# Patient Record
Sex: Female | Born: 1965 | Race: White | Hispanic: No | Marital: Single | Smoking: Current every day smoker
Health system: Southern US, Community
[De-identification: ages and names within clinical notes are randomized; demographics above are authoritative.]

## PROBLEM LIST (undated history)

## (undated) DIAGNOSIS — I10 Essential (primary) hypertension: Secondary | ICD-10-CM

## (undated) DIAGNOSIS — I2510 Atherosclerotic heart disease of native coronary artery without angina pectoris: Secondary | ICD-10-CM

## (undated) DIAGNOSIS — I219 Acute myocardial infarction, unspecified: Secondary | ICD-10-CM

## (undated) DIAGNOSIS — M549 Dorsalgia, unspecified: Secondary | ICD-10-CM

## (undated) DIAGNOSIS — F329 Major depressive disorder, single episode, unspecified: Secondary | ICD-10-CM

## (undated) DIAGNOSIS — E785 Hyperlipidemia, unspecified: Secondary | ICD-10-CM

## (undated) DIAGNOSIS — G8929 Other chronic pain: Secondary | ICD-10-CM

## (undated) DIAGNOSIS — I639 Cerebral infarction, unspecified: Secondary | ICD-10-CM

## (undated) DIAGNOSIS — H5440 Blindness, one eye, unspecified eye: Secondary | ICD-10-CM

## (undated) DIAGNOSIS — F32A Depression, unspecified: Secondary | ICD-10-CM

## (undated) HISTORY — DX: Essential (primary) hypertension: I10

## (undated) HISTORY — DX: Hyperlipidemia, unspecified: E78.5

## (undated) HISTORY — DX: Other chronic pain: G89.29

## (undated) HISTORY — DX: Dorsalgia, unspecified: M54.9

## (undated) HISTORY — PX: CORONARY STENT PLACEMENT: SHX1402

---

## 2000-05-17 DIAGNOSIS — I639 Cerebral infarction, unspecified: Secondary | ICD-10-CM

## 2000-05-17 HISTORY — DX: Cerebral infarction, unspecified: I63.9

## 2001-10-18 ENCOUNTER — Ambulatory Visit (HOSPITAL_COMMUNITY): Admission: RE | Admit: 2001-10-18 | Discharge: 2001-10-18 | Payer: Self-pay | Admitting: Internal Medicine

## 2001-10-18 ENCOUNTER — Encounter: Payer: Self-pay | Admitting: Internal Medicine

## 2003-05-18 DIAGNOSIS — I219 Acute myocardial infarction, unspecified: Secondary | ICD-10-CM

## 2003-05-18 HISTORY — DX: Acute myocardial infarction, unspecified: I21.9

## 2004-11-21 ENCOUNTER — Inpatient Hospital Stay (HOSPITAL_COMMUNITY): Admission: AD | Admit: 2004-11-21 | Discharge: 2004-11-25 | Payer: Self-pay | Admitting: Psychiatry

## 2004-11-21 ENCOUNTER — Ambulatory Visit: Payer: Self-pay | Admitting: Psychiatry

## 2004-11-24 ENCOUNTER — Encounter (HOSPITAL_COMMUNITY): Payer: Self-pay | Admitting: Psychiatry

## 2005-05-06 ENCOUNTER — Inpatient Hospital Stay (HOSPITAL_COMMUNITY): Admission: EM | Admit: 2005-05-06 | Discharge: 2005-05-09 | Payer: Self-pay | Admitting: Cardiology

## 2005-05-06 ENCOUNTER — Ambulatory Visit: Payer: Self-pay | Admitting: Cardiology

## 2005-05-07 ENCOUNTER — Ambulatory Visit: Payer: Self-pay | Admitting: Cardiology

## 2005-05-07 ENCOUNTER — Encounter: Payer: Self-pay | Admitting: Cardiology

## 2005-08-16 ENCOUNTER — Ambulatory Visit: Payer: Self-pay | Admitting: Cardiology

## 2005-11-08 ENCOUNTER — Ambulatory Visit: Payer: Self-pay | Admitting: Cardiology

## 2005-12-08 ENCOUNTER — Ambulatory Visit: Payer: Self-pay | Admitting: Cardiology

## 2006-08-28 ENCOUNTER — Emergency Department (HOSPITAL_COMMUNITY): Admission: EM | Admit: 2006-08-28 | Discharge: 2006-08-28 | Payer: Self-pay | Admitting: Emergency Medicine

## 2006-09-05 ENCOUNTER — Emergency Department (HOSPITAL_COMMUNITY): Admission: EM | Admit: 2006-09-05 | Discharge: 2006-09-06 | Payer: Self-pay | Admitting: Emergency Medicine

## 2007-05-16 ENCOUNTER — Ambulatory Visit: Payer: Self-pay | Admitting: Cardiovascular Disease

## 2007-06-12 ENCOUNTER — Ambulatory Visit: Payer: Self-pay | Admitting: Cardiovascular Disease

## 2007-06-12 ENCOUNTER — Encounter (HOSPITAL_COMMUNITY): Admission: RE | Admit: 2007-06-12 | Discharge: 2007-07-12 | Payer: Self-pay | Admitting: Cardiovascular Disease

## 2007-07-21 ENCOUNTER — Emergency Department (HOSPITAL_COMMUNITY): Admission: EM | Admit: 2007-07-21 | Discharge: 2007-07-21 | Payer: Self-pay | Admitting: Emergency Medicine

## 2007-08-09 ENCOUNTER — Emergency Department (HOSPITAL_COMMUNITY): Admission: EM | Admit: 2007-08-09 | Discharge: 2007-08-09 | Payer: Self-pay | Admitting: Emergency Medicine

## 2007-08-28 ENCOUNTER — Emergency Department (HOSPITAL_COMMUNITY): Admission: EM | Admit: 2007-08-28 | Discharge: 2007-08-28 | Payer: Self-pay | Admitting: Emergency Medicine

## 2007-09-11 ENCOUNTER — Emergency Department (HOSPITAL_COMMUNITY): Admission: EM | Admit: 2007-09-11 | Discharge: 2007-09-11 | Payer: Self-pay | Admitting: Emergency Medicine

## 2008-01-17 ENCOUNTER — Ambulatory Visit (HOSPITAL_COMMUNITY): Admission: RE | Admit: 2008-01-17 | Discharge: 2008-01-17 | Payer: Self-pay | Admitting: Family Medicine

## 2008-02-29 ENCOUNTER — Emergency Department (HOSPITAL_COMMUNITY): Admission: EM | Admit: 2008-02-29 | Discharge: 2008-02-29 | Payer: Self-pay | Admitting: Emergency Medicine

## 2008-07-14 ENCOUNTER — Emergency Department (HOSPITAL_COMMUNITY): Admission: EM | Admit: 2008-07-14 | Discharge: 2008-07-14 | Payer: Self-pay | Admitting: Emergency Medicine

## 2008-08-06 ENCOUNTER — Emergency Department (HOSPITAL_COMMUNITY): Admission: EM | Admit: 2008-08-06 | Discharge: 2008-08-06 | Payer: Self-pay | Admitting: Emergency Medicine

## 2008-08-28 ENCOUNTER — Emergency Department (HOSPITAL_COMMUNITY): Admission: EM | Admit: 2008-08-28 | Discharge: 2008-08-28 | Payer: Self-pay | Admitting: Emergency Medicine

## 2008-09-03 ENCOUNTER — Encounter (INDEPENDENT_AMBULATORY_CARE_PROVIDER_SITE_OTHER): Payer: Self-pay | Admitting: *Deleted

## 2008-09-03 LAB — CONVERTED CEMR LAB
AST: 34 units/L
Albumin: 4 g/dL
Alkaline Phosphatase: 86 units/L
BUN: 16 mg/dL
Chloride: 103 meq/L
Creatinine, Ser: 0.79 mg/dL
HDL: 53 mg/dL
LDL Cholesterol: 121 mg/dL
Total Protein: 8.4 g/dL

## 2008-09-06 ENCOUNTER — Emergency Department (HOSPITAL_COMMUNITY): Admission: EM | Admit: 2008-09-06 | Discharge: 2008-09-06 | Payer: Self-pay | Admitting: Emergency Medicine

## 2009-01-31 ENCOUNTER — Emergency Department (HOSPITAL_COMMUNITY): Admission: EM | Admit: 2009-01-31 | Discharge: 2009-01-31 | Payer: Self-pay | Admitting: Emergency Medicine

## 2009-02-20 ENCOUNTER — Emergency Department (HOSPITAL_COMMUNITY): Admission: EM | Admit: 2009-02-20 | Discharge: 2009-02-20 | Payer: Self-pay | Admitting: Emergency Medicine

## 2009-04-11 ENCOUNTER — Emergency Department (HOSPITAL_COMMUNITY): Admission: EM | Admit: 2009-04-11 | Discharge: 2009-04-11 | Payer: Self-pay | Admitting: Emergency Medicine

## 2009-05-17 HISTORY — PX: ESOPHAGOGASTRODUODENOSCOPY: SHX1529

## 2009-05-17 HISTORY — PX: COLONOSCOPY: SHX174

## 2009-06-03 ENCOUNTER — Emergency Department (HOSPITAL_COMMUNITY): Admission: EM | Admit: 2009-06-03 | Discharge: 2009-06-03 | Payer: Self-pay | Admitting: Emergency Medicine

## 2009-06-06 DIAGNOSIS — M549 Dorsalgia, unspecified: Secondary | ICD-10-CM | POA: Insufficient documentation

## 2009-06-06 DIAGNOSIS — F1010 Alcohol abuse, uncomplicated: Secondary | ICD-10-CM | POA: Insufficient documentation

## 2009-06-09 ENCOUNTER — Encounter (INDEPENDENT_AMBULATORY_CARE_PROVIDER_SITE_OTHER): Payer: Self-pay | Admitting: *Deleted

## 2009-07-20 ENCOUNTER — Encounter: Payer: Self-pay | Admitting: Orthopedic Surgery

## 2009-07-20 ENCOUNTER — Emergency Department (HOSPITAL_COMMUNITY): Admission: EM | Admit: 2009-07-20 | Discharge: 2009-07-20 | Payer: Self-pay | Admitting: Emergency Medicine

## 2009-11-07 ENCOUNTER — Emergency Department (HOSPITAL_COMMUNITY): Admission: EM | Admit: 2009-11-07 | Discharge: 2009-11-07 | Payer: Self-pay | Admitting: Emergency Medicine

## 2009-11-24 ENCOUNTER — Ambulatory Visit: Payer: Self-pay | Admitting: Orthopedic Surgery

## 2009-11-25 ENCOUNTER — Encounter (INDEPENDENT_AMBULATORY_CARE_PROVIDER_SITE_OTHER): Payer: Self-pay | Admitting: *Deleted

## 2009-11-26 ENCOUNTER — Ambulatory Visit (HOSPITAL_COMMUNITY): Admission: RE | Admit: 2009-11-26 | Discharge: 2009-11-26 | Payer: Self-pay | Admitting: Orthopedic Surgery

## 2009-12-08 ENCOUNTER — Encounter: Payer: Self-pay | Admitting: Orthopedic Surgery

## 2009-12-08 ENCOUNTER — Telehealth: Payer: Self-pay | Admitting: Orthopedic Surgery

## 2009-12-17 ENCOUNTER — Telehealth: Payer: Self-pay | Admitting: Orthopedic Surgery

## 2009-12-23 ENCOUNTER — Telehealth: Payer: Self-pay | Admitting: Orthopedic Surgery

## 2010-01-29 ENCOUNTER — Emergency Department (HOSPITAL_COMMUNITY): Admission: EM | Admit: 2010-01-29 | Discharge: 2010-01-29 | Payer: Self-pay | Admitting: Emergency Medicine

## 2010-03-10 ENCOUNTER — Emergency Department (HOSPITAL_COMMUNITY): Admission: EM | Admit: 2010-03-10 | Discharge: 2010-03-10 | Payer: Self-pay | Admitting: Emergency Medicine

## 2010-03-13 ENCOUNTER — Emergency Department (HOSPITAL_COMMUNITY): Admission: EM | Admit: 2010-03-13 | Discharge: 2010-03-13 | Payer: Self-pay | Admitting: Emergency Medicine

## 2010-04-01 ENCOUNTER — Emergency Department (HOSPITAL_COMMUNITY): Admission: EM | Admit: 2010-04-01 | Discharge: 2010-04-01 | Payer: Self-pay | Admitting: Emergency Medicine

## 2010-04-14 ENCOUNTER — Ambulatory Visit: Payer: Self-pay | Admitting: Internal Medicine

## 2010-04-15 ENCOUNTER — Encounter (INDEPENDENT_AMBULATORY_CARE_PROVIDER_SITE_OTHER): Payer: Self-pay | Admitting: *Deleted

## 2010-04-15 ENCOUNTER — Encounter: Payer: Self-pay | Admitting: Gastroenterology

## 2010-04-15 LAB — CONVERTED CEMR LAB
ALT: 61 units/L
Basophils Absolute: 0 10*3/uL
Basophils Relative: 1 %
Eosinophils Absolute: 0.1 10*3/uL
Eosinophils Relative: 1 %
Ferritin: 155 ng/mL
HCT: 46 %
Hemoglobin: 14.5 g/dL
Iron: 72 ug/dL
Monocytes Relative: 10 %
RDW: 14 %
TIBC: 353 ug/dL
TSH: 2.914 microintl units/mL
UIBC: 281 ug/dL
WBC: 6.7 10*3/uL

## 2010-04-16 ENCOUNTER — Ambulatory Visit (HOSPITAL_COMMUNITY)
Admission: RE | Admit: 2010-04-16 | Discharge: 2010-04-16 | Payer: Self-pay | Source: Home / Self Care | Admitting: Internal Medicine

## 2010-04-16 ENCOUNTER — Ambulatory Visit: Payer: Self-pay | Admitting: Internal Medicine

## 2010-04-17 DIAGNOSIS — B1710 Acute hepatitis C without hepatic coma: Secondary | ICD-10-CM

## 2010-04-17 LAB — CONVERTED CEMR LAB
A-1 Antitrypsin, Ser: 191 mg/dL
ALT: 61 U/L — ABNORMAL HIGH
AST: 59 U/L — ABNORMAL HIGH
Albumin: 4.1 g/dL
Alkaline Phosphatase: 109 U/L
Basophils Absolute: 0 K/uL
Basophils Relative: 1 %
Bilirubin, Direct: <0.1 mg/dL
Ceruloplasmin: 48 mg/dL
Eosinophils Absolute: 0.1 K/uL
Eosinophils Relative: 1 %
Ferritin: 155 ng/mL
HCT: 46 %
HCV Ab: REACTIVE — AB
Hemoglobin: 14.5 g/dL
Hepatitis B Surface Ag: NEGATIVE
Iron: 72 ug/dL
Lipase: 17 U/L
Lymphocytes Relative: 33 %
Lymphs Abs: 2.3 K/uL
MCHC: 31.5 g/dL
MCV: 99.1 fL
Monocytes Absolute: 0.7 K/uL
Monocytes Relative: 10 %
Neutro Abs: 3.7 K/uL
Neutrophils Relative %: 55 %
Platelets: 284 K/uL
RBC: 4.64 M/uL
RDW: 14 %
Saturation Ratios: 20 %
TIBC: 353 ug/dL
TSH: 2.914 u[IU]/mL
Total Bilirubin: 0.3 mg/dL
Total Protein: 7.7 g/dL
UIBC: 281 ug/dL
WBC: 6.7 10*3/microliter

## 2010-04-21 ENCOUNTER — Encounter: Payer: Self-pay | Admitting: Internal Medicine

## 2010-04-22 ENCOUNTER — Emergency Department (HOSPITAL_COMMUNITY)
Admission: EM | Admit: 2010-04-22 | Discharge: 2010-04-22 | Payer: Self-pay | Source: Home / Self Care | Admitting: Emergency Medicine

## 2010-05-02 ENCOUNTER — Emergency Department (HOSPITAL_COMMUNITY)
Admission: EM | Admit: 2010-05-02 | Discharge: 2010-05-02 | Payer: Self-pay | Source: Home / Self Care | Admitting: Emergency Medicine

## 2010-05-14 ENCOUNTER — Ambulatory Visit (HOSPITAL_COMMUNITY)
Admission: RE | Admit: 2010-05-14 | Discharge: 2010-05-14 | Payer: Self-pay | Source: Home / Self Care | Attending: Internal Medicine | Admitting: Internal Medicine

## 2010-05-19 ENCOUNTER — Encounter: Payer: Self-pay | Admitting: Internal Medicine

## 2010-05-19 LAB — CONVERTED CEMR LAB
Hep A Total Ab: NEGATIVE
Hep B S Ab: NEGATIVE

## 2010-05-20 ENCOUNTER — Encounter: Payer: Self-pay | Admitting: Internal Medicine

## 2010-06-07 ENCOUNTER — Encounter: Payer: Self-pay | Admitting: Family Medicine

## 2010-06-16 NOTE — Op Note (Signed)
NAMERODNEY, WIGGER              ACCOUNT NO.:  [id number]  MEDICAL RECORD NO.:  [id number]          PATIENT TYPE:  AMB  LOCATION:  DAY                           FACILITY:  [facility]  PHYSICIAN:  R. Roetta Sessions, M.D. 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  A CT demonstrated advanced atherosclerotic disease over the femoral area.  EGD and colonoscopy now being done.  Risks, benefits, limitations, alternatives and imponderables have been discussed, questions answered.  Please see the documentation for the medical record.  Because of polypharmacy will be done in the OR under propofol with help of Dr. Jayme Cloud and associates.  FINDINGS:  The patient was placed in the left lateral decubitus position.  Propofol anesthesia per Dr. Jayme Cloud and associates. Cetacaine spray for topical pharyngeal anesthesia.  INSTRUMENT:  Pentax video chip system.  FINDINGS:  EGD examination of the tubular esophagus revealed normal- appearing mucosa, no varices.  EG junction easily traversed.  Stomach: Gastric cavity was emptied and insufflated well with air.  Thorough examination of the gastric mucosa including retroflexion view of the proximal stomach esophagogastric junction demonstrated a large area of fairly focal erosion, superficial ulcerations and friable hemorrhagic mucosa in the antrum.  Please see photos.  No obvious infiltrating process or other abnormality.  There was no evidence of portal gastropathy.  Pylorus was patent, easily traversed.  Examination of the bulb and second portion revealed no abnormalities.  THERAPEUTIC/DIAGNOSTIC MANEUVERS PERFORMED:  Biopsies of the  abnormal antral mucosa were taken for histologic study.  The patient tolerated the procedure well and was prepared for colonoscopy.  Digital rectal exam revealed no abnormalities.  Endoscopic findings:  Prep was good. Colon:  Colonic mucosa was surveyed from the rectosigmoid junction through the left transverse right colon and appendiceal orifice, ileocecal valve/cecum.  These structures were well seen and photographed for the record.  Terminal ileum was intubated to 5-cm.  From this level, scope was slowly and cautiously withdrawn.  All previous mentioned mucosal surfaces were again seen.  The colonic mucosa appeared normal as did the terminal ileal mucosa.  Scope was pulled down into the rectum, where a thorough examination of the rectal mucosa including retroflexed view of the anal verge demonstrated no abnormalities.  The patient tolerated the procedure, was taken to PACU in stable condition.  Cecal withdrawal time 7 minutes.  IMPRESSION: 1. EGD normal esophagus. 2. Larger focal area of erosion and superficial ulceration of the     antrum uncertain significance, status post biopsy.  Remainder of     gastric mucosa appeared normal, patent pylorus, normal D1 and D2.     Colonoscopy findings; normal rectum:  Terminal ileum.  RECOMMENDATIONS: 1. Repeat screening colonoscopy in 10 years. 2. Trial of Protonix 40 mg orally daily. 3. The patient's hepatitis A and B antibody negative; therefore,     susceptible infection.  I recommend  she [facility]     and receive vaccination against hepatitis A and B. 4. Hepatitis C is for further evaluation of potential treatment.  She     should proceed on down to the [facility] [place] in     [place] for further evaluation and management of her hepatitis     C. 5. Followup on path. 6. Further recommendations to follow.     Jonathon Bellows, M.D.     RMR/MEDQ  D:  05/14/2010  T:  05/14/2010  Job:   [id number]  cc:   [facility]  Electronically Signed by Lorrin Goodell M.D. on 06/16/2010 09:07:44 AM
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
## 2010-06-16 NOTE — Miscellaneous (Signed)
  Return to [facility]  for further care   Bulging discs   MRI RESULTS RELAYED  SEE SPINE SPECIALIST FOR FURTHER CARE NECK OR BACK

## 2010-06-16 NOTE — Assessment & Plan Note (Signed)
Summary: DROPPED OFF STOOLS/SS  pt returned ifobt and it was positive  Allergies: No Known Drug Allergies  Other Orders: Immuno-chemical Fecal Occult (95638)  Appended Document: DROPPED OFF STOOLS/SS Positive. Await CT findings and then will make recommendations.  Appended Document: DROPPED OFF STOOLS/SS Pt informed.  Appended Document: DROPPED OFF STOOLS/SS see ov note addendum

## 2010-06-16 NOTE — Miscellaneous (Signed)
Summary: LABS CMP,LIPIDS,09/03/2008  Clinical Lists Changes  Observations: Added new observation of CALCIUM: 9.6 mg/dL (52/84/1324 40:10) Added new observation of ALBUMIN: 4.0 g/dL (27/25/3664 40:34) Added new observation of PROTEIN, TOT: 8.4 g/dL (74/25/9563 87:56) Added new observation of SGPT (ALT): 40 units/L (09/03/2008 12:59) Added new observation of SGOT (AST): 34 units/L (09/03/2008 12:59) Added new observation of ALK PHOS: 86 units/L (09/03/2008 12:59) Added new observation of CREATININE: 0.79 mg/dL (43/32/9518 84:16) Added new observation of BUN: 16 mg/dL (60/63/0160 10:93) Added new observation of BG RANDOM: 98 mg/dL (23/55/7322 02:54) Added new observation of CO2 PLSM/SER: 22 meq/L (09/03/2008 12:59) Added new observation of CL SERUM: 103 meq/L (09/03/2008 12:59) Added new observation of K SERUM: 5.2 meq/L (09/03/2008 12:59) Added new observation of NA: 137 meq/L (09/03/2008 12:59) Added new observation of LDL: 121 mg/dL (27/10/2374 28:31) Added new observation of HDL: 53 mg/dL (51/76/1607 37:10) Added new observation of TRIGLYC TOT: 140 mg/dL (62/69/4854 62:70) Added new observation of CHOLESTEROL: 202 mg/dL (35/00/9381 82:99)

## 2010-06-16 NOTE — Letter (Signed)
Summary: CT ABD/PEL ORDER  CT ABD/PEL ORDER   Imported By: Ave Filter 04/14/2010 11:21:14  _____________________________________________________________________  External Attachment:    Type:   Image     Comment:   External Document

## 2010-06-16 NOTE — Progress Notes (Signed)
Summary: [facility] fax'g referral to [facility]  Phone Note From Other Clinic   Caller: Referral Coordinator Summary of Call: Dois Davenport called from [facility] , ph # [phone], to verify referral to [facility].  States patient is calling them to get her referral appointment.  They have rec'd office notes and MRI rpt and will fax the referral and will notify patient of status. Initial call taken by: Cammie Sickle,  December 23, 2009 2:49 PM     Appended Document: STOMACH PAINS/SS Spoke with patient. Discussed likelihood she has HCV. She now tells me her boyfriend has hepatitis (she has been with him off/on for 20 yrs). She c/o severe constipation unresponsive to OTC laxatives. She also notes she is on ibuprofen 800mg  4-6 per day for back pain and asa 325mg  daily.  Labs show HCV Ab reactive. H/H good. AST/ALT mildly elevated.  ifobt + CT showed constipation.  1. HCV RNA quant 2. Add Miralax 17 g by mouth daily for next one week and then as needed constipation. 3. Will need EGD and TCS. Will discuss Plavix and ASA with RMR next Monday.  4. Stop ibuprofen for now.  5. Hep B surface Antibody/Hep A antibody   Clinical Lists Changes  Problems: Added new problem of HEPATITIS C (ICD-070.51) Medications: Added new medication of ASPIRIN 325 MG TABS (ASPIRIN) one by mouth daily Added new medication of IBUPROFEN 800 MG TABS (IBUPROFEN) 4-6 daily prn Added new medication of POLYETHYLENE GLYCOL 3350  POWD (POLYETHYLENE GLYCOL 3350) 17 grams by mouth daily for next one week and then daily as needed constipation. PLEASE DELIVER - Signed Rx of POLYETHYLENE GLYCOL 3350  POWD (POLYETHYLENE GLYCOL 3350) 17 grams by mouth daily for next one week and then daily as needed constipation. PLEASE DELIVER;  #527g x 5;  Signed;  Entered by: Leanna Battles Dixon Boos;  Authorized by: Leanna Battles. Lewis PA-C;  Method used: Electronically to [facility]*, [address] [place], [place], [place]   [address], Ph: [phone], Fax: [phone] Orders: Added new Test order of T-Hepatitis C RNA Quant PCR ([id number]) - Signed Added new Test order of T-Hepatitis A Antibody ([id number]) - Signed Added new Test order of T-Hepatitis B Surface Antibody ([id number]) - Signed    Prescriptions: POLYETHYLENE GLYCOL 3350  POWD (POLYETHYLENE GLYCOL 3350) 17 grams by mouth daily for next one week and then daily as needed constipation. PLEASE DELIVER  #527g x 5   Entered and Authorized by:   Leanna Battles. Dixon Boos   Signed by:   Leanna Battles Dixon Boos on 04/17/2010   Method used:   Electronically to        [facility]* (retail)       [address]       [place], [place]  [address]       Ph: [phone]       Fax: [phone]   RxID:   [id number]    Appended Document: STOMACH PAINS/SS Not noted above. Long discussion with patient regarding HCV, modes of transmission, etc. She was also encouraged to stop all alcohol use. She is aware she cannot undergo HCV tx as long as she is still drinking.  Appended Document: STOMACH PAINS/SS I'm ok with her staying on ASA and plavix for procedures but would stop all other NSAIDS  x 3 days   Appended Document: STOMACH PAINS/SS spoke with pt, explained miralax instructions again. She will go to  lab as soon as she can. orders have been faxed  Appended Document: STOMACH PAINS/SS Please schedule EGD/TCS with RMR in OR due to h/o alcohol abuse. Dx: heme positive stool, abd pain, ?melena. May continue Plavix and ASA. No NSAIDS (ibuprofen).   Appended Document: STOMACH PAINS/SS I called short stay to schedule procedure and they are having technical problems. I will call back later.

## 2010-06-16 NOTE — Letter (Signed)
Summary: History form  History form   Imported By: Jacklynn Ganong 12/01/2009 08:22:59  _____________________________________________________________________  External Attachment:    Type:   Image     Comment:   External Document

## 2010-06-16 NOTE — Progress Notes (Signed)
Summary: new phone #  Phone Note Call from Patient   Summary of Call: [name] called to give her new address and phone number New # is [phone] to call for MRI results Initial call taken by: Jacklynn Ganong,  December 08, 2009 4:38 PM  Follow-up for Phone Call        tan

## 2010-06-16 NOTE — Progress Notes (Signed)
Summary: call back from patient for MRI results  Phone Note Call from Patient   Caller: Patient Summary of Call: Patient called to relay that her Ph#'s have been disconnected and Dr Romeo Apple was probably trying to call with MRI results. Please CALL New cell# [phone]. Initial call taken by: Cammie Sickle,  December 08, 2009 10:33 AM

## 2010-06-16 NOTE — Letter (Signed)
Summary: EGD/TCS ORDER  EGD/TCS ORDER   Imported By: Ave Filter 04/21/2010 12:19:07  _____________________________________________________________________  External Attachment:    Type:   Image     Comment:   External Document

## 2010-06-16 NOTE — Miscellaneous (Signed)
Summary: mri [facility] 11/26/09 1130am  Clinical Lists Changes  recert for [facility] medicaid [id number] expires 12/25/09, advised pt of appt time aph, Dr will call with results

## 2010-06-16 NOTE — Letter (Signed)
Summary: RAD REPORT U/S ABD  RAD REPORT U/S ABD   Imported By: Rexene Alberts 04/15/2010 14:33:41  _____________________________________________________________________  External Attachment:    Type:   Image     Comment:   External Document

## 2010-06-16 NOTE — Miscellaneous (Signed)
Summary: pt said she has had tramadol before wanted stronger med  Clinical Lists Changes  I advised we could not give stronger, we are ordering MRI

## 2010-06-16 NOTE — Progress Notes (Signed)
Summary: patient called [facility],they need which spine specialist  Phone Note Call from Patient   Caller: Patient Summary of Call: Patient called back after contacting [facility] about referral to spine specialist per Dr Harrison's note.  States they need name of doctor or group. Initial call taken by: Cammie Sickle,  December 17, 2009 12:09 PM  Follow-up for Phone Call        [facility] is fine Follow-up by: Ether Griffins,  December 17, 2009 1:00 PM  Additional Follow-up for Phone Call Additional follow up Details #1::        [facility] Department and patient. Additional Follow-up by: Cammie Sickle,  December 17, 2009 4:22 PM

## 2010-06-18 NOTE — Letter (Signed)
Summary: [facility] C REFERRAL  [facility] C REFERRAL   Imported By: Ave Filter 05/19/2010 15:23:11  _____________________________________________________________________  External Attachment:    Type:   Image     Comment:   External Document

## 2010-06-18 NOTE — Miscellaneous (Signed)
Summary: Orders Update  Clinical Lists Changes  Orders: Added new Test order of T-Helicobacter AB - IgG ([id number]) - Signed 

## 2010-06-18 NOTE — Letter (Signed)
Summary: [facility] CLINIC APPT CONFIRMATION  [facility] CLINIC APPT CONFIRMATION   Imported By: Ave Filter 05/20/2010 10:26:55  _____________________________________________________________________  External Attachment:    Type:   Image     Comment:   External Document

## 2010-06-18 NOTE — Assessment & Plan Note (Signed)
Summary: STOMACH PAINS/SS   Visit Type:  New Patient Primary Care Provider:  [facility] Dept  Chief Complaint:  abd pain.  History of Present Illness: Ms. Taketa is here for further evaluation of chronic abd pain. She was seen in ED at [facility] 03/13/10.  C/O stomach pain for years. Intermittent pain, sometimes worse than others. Right sided abd pain, worse with movement, knife-like. Unrelated to meals. Tries to lay still until goes away. C/O SOB, fatigue. Doesn't feel well. Lays around the house. Lives alone. Has not had drivers licence in over 15 years since she lost it for DWI. No n/v, heartburn, dysphagia. Appetite intermittent. Often makes herself eat. Weight fluctuates but she c/o weight loss (not documented). BM irregular. BM with intermittent constipation. No brbpr.  Some black stools. Cough up black stuff for years.   Abd U/S 03/13/10: unremarkable CXR 03/13/10: negative U/A negative, lipase 31, Na 136, K 3.8, BUN 10, Cre 0.78, Glucose 136, Tbili 0.6, AP 62, AST 51, ALT 57, alb 4, WBC 7000, H/H 15.1/44.5, Plt 250,000,     Current Medications (verified): 1)  Plavix 75 Mg Tabs (Clopidogrel Bisulfate) .... Take 1 Tab Daily 2)  Lipitor 40 Mg Tabs (Atorvastatin Calcium) .... Take 1 Tab Daily 3)  Accupril 10 Mg Tabs (Quinapril Hcl) .... Take 1 Tab Daily 4)  Tramadol Hcl 50 Mg Tabs (Tramadol Hcl) .Marland Kitchen.. 1 By Mouth Q 6 Hrs As Needed Pain 5)  Mirtazapine 15 Mg Tabs (Mirtazapine) .... At Bedtime 6)  Vitamin E .... Once Daily 7)  Vitamin B-12 .... Once Daily  Allergies (verified): No Known Drug Allergies  Past History:  Past Medical History: BACK PAIN, CHRONIC (ICD-724.5). 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  Bulging desiccated annulus at L3-4. Mild bulging annulus and L4-5. 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 s/p bare metal stent right  coronary artery ETOH abuse Tobacco abuse Hyperlipidemia HTN Anxiety Disorder Depression  Past Surgical History: cath with stent placement D+C  Family History: FH of Cancer, Maternal GM - not sure what kind.  No FH of CRC. Brother, alcohol cirrhosis.  Family History of Diabetes Family History Coronary Heart Disease female < 70 Family History of Arthritis Hx, family, chronic respiratory condition Hx, family, asthma Hx, family, kidney disease NEC  Social History: Tobacco Use - smokes one pack to one and 1/2 ppd Single. Divorced. 3 children.  Disabled Regular Exercise - no Drug Use - No drugs in several years. In past, cocaine, marijuana. Alcohol Use - 2-3 12 ounce beers or 2 24 ounce beers daily  tea and soda daily 10th grade ed.  Review of Systems General:  Complains of anorexia, fatigue, and weight loss; denies fever, chills, and sweats. Eyes:  Denies vision loss. ENT:  Denies nasal congestion, sore throat, hoarseness, and difficulty swallowing. CV:  Denies chest pains, angina, palpitations, dyspnea on exertion, and peripheral edema. Resp:  Complains of dyspnea with exercise; denies dyspnea at rest, cough, sputum, and wheezing. GI:  See HPI. GU:  Denies urinary burning and blood in urine. MS:  Complains of low back pain; upper back pain. Derm:  Denies rash and itching. Neuro:  Complains of memory loss; denies weakness, frequent headaches, and confusion. Psych:  Complains of depression and anxiety; denies suicidal ideation. Endo:  Complains of unusual weight change. Heme:  Denies bruising and bleeding. Allergy:  Denies hives and rash.  Vital Signs:  Patient  profile:   45 year old female Height:      63 inches Weight:      146 pounds BMI:     25.96 Temp:     97.2 degrees F oral Pulse rate:   76 / minute BP sitting:   104 / 76  (left arm) Cuff size:   regular  Vitals Entered By: Hendricks Limes LPN (April 14, 2010 10:02 AM)  Physical Exam  General:  Thin, WF  in NAD. Laying down on exam table when entered room. Head:  Normocephalic and atraumatic. Eyes:  Conjunctivae pink, no scleral icterus.  Mouth:  Oropharyngeal mucosa moist, pink.  No lesions, erythema or exudate.    Neck:  Supple; no masses or thyromegaly. Lungs:  Clear throughout to auscultation. Heart:  Regular rate and rhythm; no murmurs, rubs,  or bruits. Abdomen:  Soft. Positive BS. Right sided abd tenderness. No rebound or guarding. No HSM or masses. No abd bruit or hernia. No CVA tenderness. Extremities:  No clubbing, cyanosis, edema or deformities noted. Neurologic:  Alert and  oriented x4;  grossly normal neurologically. Skin:  Intact without significant lesions or rashes. Cervical Nodes:  No significant cervical adenopathy. Psych:  Alert and cooperative. Normal mood and affect.  Impression & Recommendations:  Problem # 1:  ABDOMINAL PAIN OTHER SPECIFIED SITE (ICD-789.09) Chronic, intermittent right sided abd pain. Unrelated to meals, so unlikely biliary. ?musculoskeletal.  She has h/o HNP of lumbar back. May eventually need MRI thoracic spine. CT A/P with IV/oral contrast. Need consider chronic pancreatitis as possibility.  Orders: T-CBC w/Diff [id number]) T-Lipase [id number]) T-Hepatic Function [id number]) New Patient Level IV (62952)  Problem # 2:  ANOREXIA (ICD-783.0) Anorexia and ?weight loss. Obtain labs. CT as planned. May need EGD at some point in near future.  Orders: T-TSH [id number]) New Patient Level IV (27253)  Problem # 3:  ? of MELENA (ICD-578.1) ?melena, but normal H/H. Check ifobt. Orders: T-CBC w/Diff ([id number]) New Patient Level IV (59563)  Problem # 4:  TRANSAMINASES, SERUM, ELEVATED (ICD-790.4) Likely related to daily alcohol abuse. Check labs. She has multiple risk factors for viral hepatits. CT A/P.  Orders: T-Hepatic Function [id number]) T-Hepatitis C Antibody [id number]) T-Hepatitis B Surface Antigen  [phone]) T-Ceruloplasmin [id number]) T-Ferritin [id number]) T-Iron [id number]) T-Iron Binding Capacity (TIBC) ([id number]) T-Alpha-1-Antitrypsin Tot ([id number]) New Patient Level IV (09381)  Problem # 5:  ALCOHOL ABUSE (ICD-305.00) Etoh abuse, tobacco abuse, h/o remote drug abuse. Discussed need for alcohol cessation and smoking cessation. Patient not interested at this time.  Orders: T-Lipase [id number]) New Patient Level IV (78938)  Appended Document: STOMACH PAINS/SS Spoke with patient. Discussed likelihood she has HCV. She now tells me her boyfriend has hepatitis (she has been with him off/on for 20 yrs). She c/o severe constipation unresponsive to OTC laxatives. She also notes she is on ibuprofen 800mg  4-6 per day for back pain and asa 325mg  daily.  Labs show HCV Ab reactive. H/H good. AST/ALT mildly elevated.  ifobt + CT showed constipation.  1. HCV RNA quant 2. Add Miralax 17 g by mouth daily for next one week and then as needed constipation. 3. Will need EGD and TCS. Will discuss Plavix and ASA with RMR next Monday.  4. Stop ibuprofen for now.  5. Hep B surface Antibody/Hep A antibody   Clinical Lists Changes  Problems: Added new problem of HEPATITIS C (ICD-070.51) Medications: Added new medication of ASPIRIN 325 MG TABS (ASPIRIN) one by mouth daily Added new medication of IBUPROFEN 800 MG TABS (IBUPROFEN)  4-6 daily prn Added new medication of POLYETHYLENE GLYCOL 3350  POWD (POLYETHYLENE GLYCOL 3350) 17 grams by mouth daily for next one week and then daily as needed constipation. PLEASE DELIVER - Signed Rx of POLYETHYLENE GLYCOL 3350  POWD (POLYETHYLENE GLYCOL 3350) 17 grams by mouth daily for next one week and then daily as needed constipation. PLEASE DELIVER;  #527g x 5;  Signed;  Entered by: Leanna Battles Dixon Boos;  Authorized by: Leanna Battles. Lewis PA-C;  Method used: Electronically to [facility]*, [address], [place], [place],  [place]  [address], Ph: [phone], Fax: [phone] Orders: Added new Test order of T-Hepatitis C RNA Quant PCR ([id number]) - Signed Added new Test order of T-Hepatitis A Antibody ([id number]) - Signed Added new Test order of T-Hepatitis B Surface Antibody ([id number]) - Signed    Prescriptions: POLYETHYLENE GLYCOL 3350  POWD (POLYETHYLENE GLYCOL 3350) 17 grams by mouth daily for next one week and then daily as needed constipation. PLEASE DELIVER  #527g x 5   Entered and Authorized by:   Leanna Battles. Dixon Boos   Signed by:   Leanna Battles Dixon Boos on 04/17/2010   Method used:   Electronically to        [facility]* (retail)       [address]       [place], [place]  [address]       Ph: [phone]       Fax: [phone]   RxID:   [id number]    Appended Document: STOMACH PAINS/SS Not noted above. Long discussion with patient regarding HCV, modes of transmission, etc. She was also encouraged to stop all alcohol use. She is aware she cannot undergo HCV tx as long as she is still drinking.  Appended Document: STOMACH PAINS/SS I'm ok with her staying on ASA and plavix for procedures but would stop all other NSAIDS  x 3 days   Appended Document: STOMACH PAINS/SS spoke with pt, explained miralax instructions again. She will go to lab as soon as she can. orders have been faxed  Appended Document: STOMACH PAINS/SS Please schedule EGD/TCS with RMR in OR due to h/o alcohol abuse. Dx: heme positive stool, abd pain, ?melena. May continue Plavix and ASA. No NSAIDS (ibuprofen).   Appended Document: STOMACH PAINS/SS I called short stay to schedule procedure and they are having technical problems. I will call back later.

## 2010-06-25 ENCOUNTER — Encounter (INDEPENDENT_AMBULATORY_CARE_PROVIDER_SITE_OTHER): Payer: Self-pay | Admitting: *Deleted

## 2010-06-30 ENCOUNTER — Encounter: Payer: Self-pay | Admitting: Cardiology

## 2010-06-30 ENCOUNTER — Encounter (INDEPENDENT_AMBULATORY_CARE_PROVIDER_SITE_OTHER): Payer: Self-pay | Admitting: *Deleted

## 2010-06-30 ENCOUNTER — Encounter (INDEPENDENT_AMBULATORY_CARE_PROVIDER_SITE_OTHER): Payer: Medicaid Other | Admitting: Cardiology

## 2010-06-30 DIAGNOSIS — I2510 Atherosclerotic heart disease of native coronary artery without angina pectoris: Secondary | ICD-10-CM | POA: Insufficient documentation

## 2010-06-30 DIAGNOSIS — F17200 Nicotine dependence, unspecified, uncomplicated: Secondary | ICD-10-CM | POA: Insufficient documentation

## 2010-06-30 DIAGNOSIS — E785 Hyperlipidemia, unspecified: Secondary | ICD-10-CM | POA: Insufficient documentation

## 2010-06-30 DIAGNOSIS — K2960 Other gastritis without bleeding: Secondary | ICD-10-CM | POA: Insufficient documentation

## 2010-07-02 NOTE — Miscellaneous (Signed)
Summary: labs ibc,iron,cbcd,liver,lipase,tsh,ferritin11/30/2011  Clinical Lists Changes  Observations: Added new observation of FERRITIN: 155 ng/mL (04/15/2010 15:37) Added new observation of IRON SATUR %: 20 % (04/15/2010 15:37) Added new observation of TIBC: 353 mcg/dL (16/02/9603 54:09) Added new observation of UIBC: 281 mcg/dL (81/19/1478 29:56) Added new observation of IRON: 72 mcg/dL (21/30/8657 84:69) Added new observation of LIPASE SERUM: 17 units/L (04/15/2010 15:37) Added new observation of ALBUMIN: 4.1 g/dL (62/95/2841 32:44) Added new observation of PROTEIN, TOT: 7.7 g/dL (05/19/7251 66:44) Added new observation of SGPT (ALT): 61 units/L (04/15/2010 15:37) Added new observation of SGOT (AST): 59 units/L (04/15/2010 15:37) Added new observation of ALK PHOS: 109 units/L (04/15/2010 15:37) Added new observation of BILI DIRECT: <0.1 mg/dL (03/47/4259 56:38) Added new observation of TSH: 2.914 microintl units/mL (04/15/2010 15:37) Added new observation of ABSOLUTE BAS: 0.0 K/uL (04/15/2010 15:37) Added new observation of BASOPHIL %: 1 % (04/15/2010 15:37) Added new observation of EOS ABSLT: 0.1 K/uL (04/15/2010 15:37) Added new observation of % EOS AUTO: 1 % (04/15/2010 15:37) Added new observation of ABSOLUTE MON: 0.7 K/uL (04/15/2010 15:37) Added new observation of MONOCYTE %: 10 % (04/15/2010 15:37) Added new observation of ABS LYMPHOCY: 2.3 K/uL (04/15/2010 15:37) Added new observation of LYMPHS %: 33 % (04/15/2010 15:37) Added new observation of PLATELETK/UL: 284 K/uL (04/15/2010 15:37) Added new observation of RDW: 14.0 % (04/15/2010 15:37) Added new observation of MCHC RBC: 31.5 g/dL (75/64/3329 51:88) Added new observation of MCV: 99.1 fL (04/15/2010 15:37) Added new observation of HCT: 46.0 % (04/15/2010 15:37) Added new observation of HGB: 14.5 g/dL (41/66/0630 16:01) Added new observation of RBC M/UL: 4.64 M/uL (04/15/2010 15:37) Added new observation of WBC COUNT:  6.7 10*3/microliter (04/15/2010 15:37)

## 2010-07-08 NOTE — Letter (Signed)
Summary: [facility], [facility] at [employer]  [address], [place] [address]   Phone: [phone]  Fax: [phone]     June 30, 2010 MRN: [id number]   [facility] [address] [place], [place]  [address]      YOUR LAB WORK IS DUE   July 06, 2010  Please go to [facility], located across the street from [facility] on the second floor.  Hours are Monday - Friday 7am until 7:30pm         Saturday 8am until 12noon    _X_  DO NOT EAT OR DRINK AFTER MIDNIGHT EVENING PRIOR TO LABWORK

## 2010-07-08 NOTE — Assessment & Plan Note (Signed)
Summary: [facility]   Visit Type:  Follow-up Referring Provider:  . Primary Provider:  [facility] Dept   History of Present Illness: [name] is seen in the office today at her request.  She recently underwent GI evaluation for chronic abdominal pain including a CT of the abdomen and was told that she had peripheral vascular disease.  She has known coronary disease and was supposed to return to see Dr. Eden Emms years ago.  From a cardiac standpoint, she has done well.  She has occasional atypical chest discomfort that is fairly mild and has not required medical attention.  She has no dyspnea on exertion, orthopnea nor PND.  Unfortunately, she continues to smoke cigarettes.  She has not had hyperlipidemia evaluated for some time.  CT Scan  Procedure date:  04/16/2010  Findings:      CT ABDOMEN AND PELVIS WITH CONTRAST 04/16/2010:   Findings: Normal appearing liver, spleen, pancreas, adrenal glands,   and kidneys.  Gallbladder contracted as there is a large amount of   food within the stomach.  No biliary ductal dilation.  Small hiatal   hernia.  Stomach otherwise normal in appearance.  Small bowel   normal in appearance.  Large amount of stool throughout the   ascending colon, transverse colon, and descending colon.  Sigmoid colon tortuous and redundant, extending upward into the upper abdomen.  Normal appendix in the right mid and lower pelvis.  No ascites.  No significant lymphadenopathy.  Severe distal abdominal aortic and bilateral iliofemoral atherosclerosis.    Urinary bladder unremarkable.  Uterus and ovaries normal by CT.  No adnexal masses.  No free pelvic fluid.  Bone window images   unremarkable.  Visualized lung bases clear.    IMPRESSION:    1.  No acute abdominal abnormalities apart from possible   constipation.   2.  Small hiatal hernia.   3.  Severe aorto-iliofemoral atherosclerosis for age.    Read By:  Arnell Sieving,   M.D.   Preventive Screening-Counseling & Management  Alcohol-Tobacco     Smoking Status: current  Current Medications (verified): 1)  Quinapril Hcl 5 Mg Tabs (Quinapril Hcl) .... Take 1 Tab Daily 2)  Tramadol Hcl 50 Mg Tabs (Tramadol Hcl) .Marland Kitchen.. 1 By Mouth Q 6 Hrs As Needed Pain 3)  Mirtazapine 15 Mg Tabs (Mirtazapine) .... At Bedtime 4)  Vitamin E 400 Unit Caps (Vitamin E) .... Take 1 Tab Daily 5)  Vitamin B-12 500 Mcg Tabs (Cyanocobalamin) .... Take 1 Tab Daily 6)  Ibuprofen 800 Mg Tabs (Ibuprofen) .... 4-6 Daily Prn 7)  Pantoprazole Sodium 40 Mg Tbec (Pantoprazole Sodium) .... Take 1 Tab Daily 8)  Remifemin 20 Mg Tabs (Black Cohosh) .... Take 2 Tabs Daily 9)  Nitrostat 0.4 Mg Subl (Nitroglycerin) .... Take As Needed For Chest Pain 10)  Pravastatin Sodium 40 Mg Tabs (Pravastatin Sodium) .... Take 1 Tab Daily 11)  Hydrocodone-Acetaminophen 5-325 Mg Tabs (Hydrocodone-Acetaminophen) .... Take As Directed For Pain 12)  Doxepin Hcl 25 Mg Caps (Doxepin Hcl) .Marland Kitchen.. 1 To 2 Tabs At Bedtime As Needed 13)  Aspirin 81 Mg Tbec (Aspirin) .... Take One Tablet By Mouth Daily  Allergies (verified): No Known Drug Allergies  Comments:  Nurse/Medical Assistant: patient brought meds she is confused about [facility]  Past History:  Family History: Last updated: 2010-07-10 Father: Died at age 32 due to cardiac disease Mother: Died at age 4 with a CVA Siblings: Brother with alcoholism and cirrhosis; second  brother with CVA; 2 sisters, one with multiple myocardial infarctions.  Positive family history for diabetes, ASCVD, arthritis, chronic lung disease and renal disease.  Social History: Last updated: 06/30/2010 Tobacco Use - 60 pack years continuing at one pack per day Marital: Divorced. 3 children.  Employment-disabled Regular Exercise - no Drug Use - No drugs in several years. In past, cocaine, marijuana. Alcohol Use - 2-3 12 ounce beers or 2 24 ounce beers daily  10th grade  ed.  Past Medical History: ASCVD: BMS to RCA for acute inferior MI associated with cocaine-04/2005; Hyperlipidemia Hypertension Chronic back pain:disc protrusion at T12-L1; Bulging annulus at L3-4,  L4-5. Alcohol abuse H/o CVA, age 80, but negative MRI Tobacco abuse-30 pack years; one pack per day Anxiety Disorder Depression  Past Surgical History: Dilatation and curettage Colonoscopy: 04/2010  Family History: Father: Died at age 53 due to cardiac disease Mother: Died at age 30 with a CVA Siblings: Brother with alcoholism and cirrhosis; second brother with CVA; 2 sisters, one with multiple myocardial infarctions.  Positive family history for diabetes, ASCVD, arthritis, chronic lung disease and renal disease.  Social History: Tobacco Use - 60 pack years continuing at one pack per day Marital: Divorced. 3 children.  Employment-disabled Regular Exercise - no Drug Use - No drugs in several years. In past, cocaine, marijuana. Alcohol Use - 2-3 12 ounce beers or 2 24 ounce beers daily  10th grade ed. Smoking Status:  current  Review of Systems       Corrective lenses required; upper and lower dentures; recent diagnosis of gastritis; diffuse arthritic discomfort for which she would like to take narcotics but actually takes ibuprofen, which she reports does not provide much benefit  Vital Signs:  Patient profile:   45 year old female Weight:      152 pounds BMI:     27.02 O2 Sat:      96 % on Room air Pulse rate:   66 / minute BP sitting:   136 / 86  (left arm)  Vitals Entered By: Dreama Saa, CNA (June 30, 2010 11:12 AM)  O2 Flow:  Room air  Physical Exam  General:  Appears older than her age; well developed; no acute distress:   Neck-No JVD; no carotid bruits: Lungs-No tachypnea, no rales; no rhonchi; no wheezes; intermittent cough productive of sputum Cardiovascular-normal PMI; normal S1 and S2; S4; modest systolic murmur at the cardiac base Abdomen-BS  normal; soft and non-tender without masses or organomegaly:  Musculoskeletal-No deformities, no cyanosis or clubbing: Neurologic-Normal cranial nerves; symmetric strength and tone:  Skin-Warm, no significant lesions: Extremities- distal pulses intact; no edema:     Impression & Recommendations:  Problem # 1:  EROSIVE GASTRITIS (ICD-535.40) Symptoms have improved following treatment with a PPI.  Patient was told to discontinue aspirin, but actually needs to discontinue Plavix from a cardiovascular standpoint.  Aspirin will be resumed in the enteric coated form at a dose of 81 mg.  This should not cause significant problems if treatment with a PPI is continued.  Problem # 2:  ATHEROSCLEROTIC CARDIOVASCULAR DISEASE (ICD-429.2) Patient has taken Plavix for the last 5 years despitethe fact that her initial MI was associated with cocaine use and treated with a bare-metal stent.  That medication will be discontinued and treatment with aspirin resumed.  Problem # 3:  HYPERLIPIDEMIA (ICD-272.4) Lipid profile will be reassessed.  Problem # 4:  HYPERTENSION (ICD-401.1) Blood pressure is well controlled despite the fact that she is treated with a  low-dose of a single antihypertensive agent.  Problem # 5:  TOBACCO ABUSE (ICD-305.1) Patient has never previously tried to discontinue cigarette smoking, but agrees to do so.  She was referred to the [facility] for nicotine replacement therapy and counseling.  Problem # 6:  ALCOHOL ABUSE (ICD-305.00) Patient admits to continuing consumption of beer, but denies alcohol abuse.  Other Orders: Future Orders: T-Comprehensive Metabolic Panel ([id number]) ... 07/06/2010 T-CBC w/Diff ([id number]) ... 07/06/2010 T-Lipid Profile [id number]) ... 07/06/2010  Patient Instructions: 1)  Your physician recommends that you schedule a follow-up appointment in: 1 year Dr. Eden Emms 2)  Your physician recommends that you return for lab work VH:QION  week 3)  Your physician has recommended you make the following change in your medication: stop plavix, resume aspirin 81mg  daily 4)  Your physician discussed the hazards of tobacco use.  Tobacco use cessation is recommended and techniques and options to help you quit were discussed.  Prevention & Chronic Care Immunizations   Influenza vaccine: Not documented    Tetanus booster: Not documented    Pneumococcal vaccine: Not documented  Other Screening   Pap smear: Not documented    Mammogram: Not documented   Smoking status: current  (06/30/2010)  Lipids   Total Cholesterol: 202  (09/03/2008)   LDL: 121  (09/03/2008)   LDL Direct: Not documented   HDL: 53  (09/03/2008)   Triglycerides: 140  (09/03/2008)    SGOT (AST): 59  (04/15/2010)   SGPT (ALT): 61  (04/15/2010) CMP ordered    Alkaline phosphatase: 109  (04/15/2010)   Total bilirubin: 0.3  (04/15/2010)  Hypertension   Last Blood Pressure: 136 / 86  (06/30/2010)   Serum creatinine: 0.79  (09/03/2008)   Serum potassium 5.2  (09/03/2008) CMP ordered   Self-Management Support :    Hypertension self-management support: Not documented    Lipid self-management support: Not documented

## 2010-07-27 ENCOUNTER — Telehealth: Payer: Self-pay | Admitting: *Deleted

## 2010-07-27 LAB — CBC
MCH: 31.6 pg (ref 26.0–34.0)
MCV: 93.5 fL (ref 78.0–100.0)
WBC: 6 10*3/uL (ref 4.0–10.5)

## 2010-07-27 LAB — BASIC METABOLIC PANEL
BUN: 9 mg/dL (ref 6–23)
Calcium: 9.4 mg/dL (ref 8.4–10.5)
GFR calc Af Amer: >60 mL/min (ref >60)
GFR calc non Af Amer: >60 mL/min (ref >60)
Potassium: 4 mEq/L (ref 3.5–5.1)
Sodium: 136 mEq/L (ref 135–145)

## 2010-07-27 LAB — HCG, QUANTITATIVE, PREGNANCY: hCG, Beta Chain, Quant, S: 2 m[IU]/mL (ref <5)

## 2010-07-29 LAB — URINALYSIS, ROUTINE W REFLEX MICROSCOPIC
Bilirubin Urine: NEGATIVE
Nitrite: NEGATIVE
Urobilinogen, UA: 0.2 mg/dL (ref 0.0–1.0)

## 2010-07-29 LAB — DIFFERENTIAL
Basophils Relative: 1 % (ref 0–1)
Eosinophils Absolute: 0.1 10*3/uL (ref 0.0–0.7)
Monocytes Absolute: 0.5 10*3/uL (ref 0.1–1.0)
Monocytes Relative: 7 % (ref 3–12)

## 2010-07-29 LAB — COMPREHENSIVE METABOLIC PANEL
ALT: 57 U/L — ABNORMAL HIGH (ref 0–35)
Albumin: 4 g/dL (ref 3.5–5.2)
Alkaline Phosphatase: 62 U/L (ref 39–117)
Glucose, Bld: 136 mg/dL — ABNORMAL HIGH (ref 70–99)
Potassium: 3.8 mEq/L (ref 3.5–5.1)
Sodium: 136 mEq/L (ref 135–145)
Total Protein: 8.7 g/dL — ABNORMAL HIGH (ref 6.0–8.3)

## 2010-07-29 LAB — CBC
HCT: 44.5 % (ref 36.0–46.0)
RBC: 4.73 MIL/uL (ref 3.87–5.11)
RDW: 12.5 % (ref 11.5–15.5)
WBC: 7 10*3/uL (ref 4.0–10.5)

## 2010-07-29 LAB — POCT CARDIAC MARKERS: Troponin i, poc: <0.05 ng/mL (ref 0.00–0.09)

## 2010-08-04 NOTE — Progress Notes (Signed)
Summary: Return phone call  Phone Note Call from Patient   Caller: Patient Reason for Call: Talk to Nurse Summary of Call: patient states that she was supposed to get referral to "vascular" doctor / there is no mention of this referral in office note orders / also wants to know if the labwork that was ordered will show if she has vascular disease/ tg Initial call taken by: Raechel Ache [facility],  July 27, 2010 11:44 AM  Follow-up for Phone Call        Pt kept  speaking of Dr. Jena Gauss telling her this information.  I asked pt to call his office to clarifiy information. There is no mention of vascular consult in her last office note on 06/30/2010. Follow-up by: Teressa Lower RN,  July 27, 2010 2:44 PM

## 2010-08-09 LAB — RAPID URINE DRUG SCREEN, HOSP PERFORMED
Barbiturates: NOT DETECTED
Opiates: NOT DETECTED
Tetrahydrocannabinol: NOT DETECTED

## 2010-08-09 LAB — URINALYSIS, ROUTINE W REFLEX MICROSCOPIC
Nitrite: NEGATIVE
Specific Gravity, Urine: 1.025 (ref 1.005–1.030)
Urobilinogen, UA: 2 mg/dL — ABNORMAL HIGH (ref 0.0–1.0)

## 2010-08-09 LAB — PREGNANCY, URINE: Preg Test, Ur: NEGATIVE

## 2010-08-13 ENCOUNTER — Emergency Department (HOSPITAL_COMMUNITY)
Admission: EM | Admit: 2010-08-13 | Discharge: 2010-08-13 | Disposition: A | Discharge status: Home / Self Care | Payer: Medicaid Other | Attending: Emergency Medicine | Admitting: Emergency Medicine

## 2010-08-13 ENCOUNTER — Ambulatory Visit: Payer: Medicaid Other | Admitting: Gastroenterology

## 2010-08-13 DIAGNOSIS — M25559 Pain in unspecified hip: Secondary | ICD-10-CM | POA: Insufficient documentation

## 2010-08-13 DIAGNOSIS — I2510 Atherosclerotic heart disease of native coronary artery without angina pectoris: Secondary | ICD-10-CM | POA: Insufficient documentation

## 2010-08-13 DIAGNOSIS — M79609 Pain in unspecified limb: Secondary | ICD-10-CM | POA: Insufficient documentation

## 2010-08-13 DIAGNOSIS — R109 Unspecified abdominal pain: Secondary | ICD-10-CM | POA: Insufficient documentation

## 2010-08-13 DIAGNOSIS — I252 Old myocardial infarction: Secondary | ICD-10-CM | POA: Insufficient documentation

## 2010-08-13 DIAGNOSIS — Z86718 Personal history of other venous thrombosis and embolism: Secondary | ICD-10-CM | POA: Insufficient documentation

## 2010-08-13 DIAGNOSIS — Z79899 Other long term (current) drug therapy: Secondary | ICD-10-CM | POA: Insufficient documentation

## 2010-08-21 LAB — URINALYSIS, ROUTINE W REFLEX MICROSCOPIC
Bilirubin Urine: NEGATIVE
Hgb urine dipstick: NEGATIVE
Nitrite: NEGATIVE
Specific Gravity, Urine: 1.025 (ref 1.005–1.030)
pH: 5.5 (ref 5.0–8.0)

## 2010-08-21 LAB — POCT I-STAT, CHEM 8
Creatinine, Ser: 0.6 mg/dL (ref 0.4–1.2)
Hemoglobin: 16 g/dL — ABNORMAL HIGH (ref 12.0–15.0)
Potassium: 3.9 mEq/L (ref 3.5–5.1)
Sodium: 139 mEq/L (ref 135–145)

## 2010-08-26 LAB — URINALYSIS, ROUTINE W REFLEX MICROSCOPIC
Leukocytes, UA: NEGATIVE
Nitrite: NEGATIVE
Specific Gravity, Urine: >1.03 — ABNORMAL HIGH (ref 1.005–1.030)
Urobilinogen, UA: 0.2 mg/dL (ref 0.0–1.0)

## 2010-08-26 LAB — URINE MICROSCOPIC-ADD ON

## 2010-08-31 ENCOUNTER — Encounter: Payer: Medicaid Other | Admitting: Surgery

## 2010-09-01 LAB — URINALYSIS, ROUTINE W REFLEX MICROSCOPIC
Bilirubin Urine: NEGATIVE
Hgb urine dipstick: NEGATIVE
Protein, ur: NEGATIVE mg/dL
Urobilinogen, UA: 0.2 mg/dL (ref 0.0–1.0)

## 2010-09-14 ENCOUNTER — Encounter (INDEPENDENT_AMBULATORY_CARE_PROVIDER_SITE_OTHER): Payer: Medicaid Other | Admitting: Surgery

## 2010-09-14 ENCOUNTER — Encounter (INDEPENDENT_AMBULATORY_CARE_PROVIDER_SITE_OTHER): Payer: Medicaid Other

## 2010-09-14 ENCOUNTER — Other Ambulatory Visit (INDEPENDENT_AMBULATORY_CARE_PROVIDER_SITE_OTHER): Payer: Medicaid Other

## 2010-09-14 DIAGNOSIS — G459 Transient cerebral ischemic attack, unspecified: Secondary | ICD-10-CM

## 2010-09-14 DIAGNOSIS — I7092 Chronic total occlusion of artery of the extremities: Secondary | ICD-10-CM

## 2010-09-14 DIAGNOSIS — I700 Atherosclerosis of aorta: Secondary | ICD-10-CM

## 2010-09-15 NOTE — Assessment & Plan Note (Signed)
OFFICE VISIT  MARIYA, MOTTLEY L DOB:  January 19, 1966                                       09/14/2010 CHART#:[id number]  REASON FOR VISIT:  Aortoiliac occlusive disease.  HISTORY:  This is a 45 year old female I am seeing at the request of Dr. Dimas Aguas for evaluation of aortoiliofemoral atherosclerosis that was detected on a CT scan.  The patient had a CT scan of her abdomen in December for evaluation of chronic abdominal pain.  Findings included severe aortoiliofemoral atherosclerosis, advanced for her age, and therefore she was sent to me for further evaluation.  The patient has a history of heart attack, requiring treatment with a bare metal stent.  The etiology of her MI was cocaine.  She also has a history of tobacco abuse which is ongoing as well as hypertension and hypercholesterolemia.  She has a significant family history for premature cardiovascular disease.  The patient reports having mini strokes where her mouth draws up and her face turns "sideways".  She complains of cramping at night with rest which occurs every night.  Her leg problems date back to when she was young.  REVIEW OF SYSTEMS:  GENERAL:  Positive for weight gain. VASCULAR:  Positive for pain in legs with walking, when lying flat, mini strokes, slurred speech. CARDIAC:  As above. GI:  Positive for blood in her stool, trouble swallowing, constipation. GU:  Positive for difficulty with urination. ENT:  Positive for recent change in eyesight. MUSCULOSKELETAL:  Positive for arthritis, joint and muscle pain. PSYCHIATRIC:  Positive for depression, anxiety. SKIN:  Positive for stomach ulcer.  PAST MEDICAL HISTORY:  Hypertension, hypercholesterolemia, history of MI with stenting, cocaine abuse, tobacco abuse.  SOCIAL HISTORY:  She is single.  She smokes a pack a day.  Does not drink.  FAMILY HISTORY:  Positive for stroke in her mother and brother, heart attack in her father and a  sister with heart attack.  PHYSICAL EXAM:  Vital signs:  Heart rate 79, blood pressure 126/88, respirations 24.  General:  She is well-appearing, in no distress. HEENT:  Within normal limits.  Respirations:  Nonlabored. Cardiovascular:  Regular rate and rhythm.  No carotid bruits.  Pedal pulses are palpable.  Abdomen:  Soft, nontender.  Musculoskeletal:  No major deformity.  Neurological:  No focal deficits.  Skin:  Without rash.  DIAGNOSTIC STUDIES:  CT scan:  I reviewed her CT scan which shows atherosclerotic changes to her distal aorta and iliac vessels.  Her vessels are small without significant stenosis.  Vascular lab ultrasound:  The patient has ABIs of 1.0 bilaterally with triphasic pedal waveforms.  Carotid ultrasound:  40%-59% stenosis on the left, <40% on the right.  ASSESSMENT AND PLAN:  Atherosclerotic disease:  I think the patient's atherosclerotic changes on CT scan have multiple etiologies including family history and her tobacco abuse.  Despite their appearance on CT there was no hemodynamically significant stenosis based on the fact that she has palpable pulses and triphasic waveforms.  At this point we need to focus on risk factor prevention which would include smoking cessation which I extensively discussed with her today.  The patient is complaining of TIA like symptoms.  I did her carotid ultrasound today which does not suggest that this is the etiology for her symptoms.  The patient will follow up with me on a p.r.n. basis.  Jorge Ny, MD Electronically Signed  VWB/MEDQ  D:  09/14/2010  T:  09/15/2010  Job:  [id number]  cc:   Selinda Flavin, MD Noralyn Pick. Eden Emms, MD, [facility]

## 2010-09-22 NOTE — Procedures (Unsigned)
CAROTID DUPLEX EXAM  INDICATION:  TIAs.  HISTORY: Diabetes:  No. Cardiac:  MI, stents. Hypertension:  Yes. Smoking:  Yes. Previous Surgery:  No carotid surgery. CV History:  Temporary facial and possible vision changes per the patient, asymptomatic now. Amaurosis Fugax No, Paresthesias No, Hemiparesis No                                      RIGHT             LEFT Brachial systolic pressure:         121               123 Brachial Doppler waveforms: Vertebral direction of flow:        Antegrade         Antegrade DUPLEX VELOCITIES (cm/sec) CCA peak systolic                   97                88 ECA peak systolic                   134               142 ICA peak systolic                   80                128 ICA end diastolic                   27                46 PLAQUE MORPHOLOGY:                  Calcific          Calcific PLAQUE AMOUNT:                      Mild              Mild / moderate PLAQUE LOCATION:                    Bifurcation / proximal ICA          Bifurcation / proximal ICA  IMPRESSION: 1. Right internal carotid artery shows no evidence of hemodynamically     significant stenosis, <40%. 2. Left internal carotid artery 40%-59% stenosis (low end of range). 3. Bilaterally fairly focal area of plaque at the bifurcations.  ___________________________________________ V. Charlena Cross, MD  AS/MEDQ  D:  09/15/2010  T:  09/15/2010  Job:  [id number]

## 2010-09-29 NOTE — Assessment & Plan Note (Signed)
[facility]                       [place] OFFICE NOTE   [name], [name]                     MRN:          [id number]  DATE:05/16/2007                            DOB:          [birth year]    [name] is an unfortunate 45 year old lady referred by Dr. Duayne Cal  from [facility] for recurrent chest pain and question  TIAs.  The patient has a history of an old inferior wall myocardial  infarction in December 2006.  This was in the setting of multiple drug  abuse including cocaine.  She had bare metal stent to the right coronary  artery with Dr. Riley Kill.   She has had somewhat poor followup since then.  She was seen by Tereso Newcomer, PA-C in June 2007.  At that time, the patient was seen by Dr.  Daleen Squibb.  Dr. Daleen Squibb felt that the patient had unstable angina and required  hospitalization.  The patient refused to be admitted.   She has been having increasing chest pain.  Pain is somewhat atypical.  It is nonexertional.  He can last most of the day.  It is a sharp  feeling in her chest that can radiate to the back.  There is no  associated diaphoresis or shortness of breath.  There are no  palpitations.   The pain has been somewhat progressive over the last 3 months.   Duration of the pain has been approximately 6 to 8 months.  The patient  says she is no longer doing drugs.  She has finished an inpatient  program and is currently going through an outpatient program Dr. Duayne Cal  has seen her on a regular basis.  She had been living at a homeless  shelter.   She is currently taking medication to stay dry in regards to her  alcoholism.   She is smoking about a pack a day.   She does get her medications through her [facility].  She  has been compliant with her aspirin and Plavix in particular.   REVIEW OF SYSTEMS:  Otherwise remarkable for question of TIAs.  She said  she has had a previous stroke and that the  right side of her mouth  continues to draw.  She thinks she may be having recurrent TIAs.  She  has significant problems with her memory.  There has been no focal  neurological defects, but again the patient feels that she has some  recurrent problems in regards to the right side of her face and her  memory.   PAST MEDICAL HISTORY:  Otherwise remarkable for cocaine abuse and  alcoholism, smoking, previous IMI in 2006, hypertension,  hypercholesterolemia, question previous CVA.   Her family history is noncontributory.   The patient lives by herself.  She has an apartment in town.  She had  been at the homeless shelter.  She sees Dr. Duayne Cal at [facility].  She is otherwise fairly sedentary.  She continues to smoke but  denies current alcohol or drug use.   MEDICATIONS:  1. Plavix 75 a day.  2. Aspirin a day.  3. Accupril 5 a day.  4. Lipitor 40 a day.  5. Multivitamins.  6. Campral for alcohol cessation 333 mg 2 tablets t.i.d.   NKDA   EXAM:  Is remarkable for a chronically ill-appearing young white female  in no distress.  Weight is 139, blood pressure is 100/70, pulse 78 regular, respiratory  rate 14, afebrile.  HEENT:  Unremarkable.  Carotids are without bruit.  No lymphadenopathy, thyromegaly, JVP  elevation.  LUNGS:  Clear diaphragmatic motion.  No wheezing.  S1, S2 with normal heart sounds.  PMI normal.  ABDOMEN:  Bowel sounds positive.  No bruit.  No hepatosplenomegaly.  No  hepatojugular reflux.  No tenderness.  Distal pulses intact.  No edema.  PT +3.  NEURO:  Nonfocal.  There is no real facial droop.  There is no  trigeminal neuralgia or sensory deficit along these lines.   Her EKG shows sinus rhythm with old IMI.   IMPRESSION:  1. Coronary disease, somewhat atypical chest pain.  Followup stress      Myoview.  Continue aspirin and Plavix.  The patient has not been on      beta blockers in the past due to cocaine use.  2. Question of transient  ischemic attack or cerebrovascular accident      with poor memory.  This may be secondary to her longstanding      history of previous drug use.  However, I think it is reasonable to      check an MRI and make sure she is not having embolic events.      Continue aspirin and Plavix.  3. History of polydrug abuse.  Continue to follow with Dr. Duayne Cal.      Continue Campral in regards to abstinence from alcohol.  Hopefully,      the [facility] is doing random drug screens.  4. Hypertension.  Currently the patient's blood pressure is quite low.      If anything may consider stopping her Accupril in the future.  We      will see what her LV function is on Myoview.  5. Smoking cessation.  This will be difficult given the patient's      previous history.  Encouraged to cut back on her cigarettes.  Would      not necessarily use Chantix or Wellbutrin with other centrally      acting agents.  If her Myoview is low risk, she may be a candidate      for Nicoderm patch.  6. Hypercholesteremia in the setting of a previous coronary disease.      Continue Lipitor 40 day.  Lipid liver profile in 6 months.   Further recommendations will be based on the results of the patient's  MRI and stress test.  Unfortunately, I do not think, since she is not  working, that we can get her in to see a neurologist.     Theron Arista C. Eden Emms, MD, [facility]  Electronically Signed    PCN/MedQ  DD: 05/16/2007  DT: 05/16/2007  Job #: [id number]

## 2010-10-02 NOTE — Discharge Summary (Signed)
NAMECHENNEL, OLIVOS              ACCOUNT NO.:  [id number]   MEDICAL RECORD NO.:  [id number]          PATIENT TYPE:  INP   LOCATION:  2912                         FACILITY:  [facility]   PHYSICIAN:  Arturo Morton. Riley Kill, M.D. [facility] OF BIRTH:  10/08/1965   DATE OF ADMISSION:  05/06/2005  DATE OF DISCHARGE:  05/09/2005                                 DISCHARGE SUMMARY   ADDENDUM   The patient will follow up with Dr. __________ Demetrius Charity.A. in two weeks.  [facility] has been called.     ______________________________  April Humphrey, NP      Arturo Morton. Riley Kill, M.D. [facility]  Electronically Signed    AH/MEDQ  D:  05/09/2005  T:  05/11/2005  Job:  [id number]

## 2010-10-02 NOTE — H&P (Signed)
[name], [name]              ACCOUNT NO.:  [id number]   MEDICAL RECORD NO.:  [id number]          PATIENT TYPE:  INP   LOCATION:  2912                         FACILITY:  [facility]   PHYSICIAN:  Salvadore Farber, M.D. LHCDATE OF BIRTH:  1966/04/28   DATE OF ADMISSION:  05/06/2005  DATE OF DISCHARGE:                                HISTORY & PHYSICAL   PATIENT PROFILE:  45 year old white female with no prior history of CAD who  presented on transfer from [facility] with acute inferior ST  elevation MI following cocaine use.   PROBLEM LIST:  1.  Acute inferior ST elevation myocardial infarction.  2.  Polysubstance abuse using daily cocaine, one packet of cigarettes a day,      and alcohol.   HISTORY OF PRESENT ILLNESS:  A 45 year old white female with no prior  history of CAD.  She does have a history of polysubstance abuse.  Patient is  quite lethargic at this time and is unable to give a complete history.  Per  limited outside records patient used cocaine yesterday and developed chest  pain some time after dinner (after eating at [employer]) around 10 p.m.  She  presented to the [facility] ER at 4:14 a.m. and an EKG was done at 5:30 a.m.  showing 3-4 mm inferior ST segment elevation with reciprocal changes.  Decision was made to transfer at 6:30 a.m., a full 2 hours and 15 minutes  after arrival to the ED.  She finally arrived in the [facility]  Catheterization Laboratory at 7:30 a.m. with catheterization revealing 80%  stenosis in the mid RCA with total occlusion distally.  Both areas were  successfully stented with [employer] bare metal stents.  She  tolerated this procedure well with the exception of low blood pressures  requiring dopamine therapy.  She is currently fairly lethargic, will wake up  only briefly to answer questions.   ALLERGIES:  No known drug allergies.   HOME MEDICATIONS:  None.   FAMILY HISTORY:  Patient is unable to communicate this at this  time.   SOCIAL HISTORY:  [place].  She is not able to tell me what she does for a living.  She is divorced and  has three children.  She does smoke one packet of cigarettes a day.  She  does use cocaine daily per outside records and apparently also uses alcohol.   REVIEW OF SYSTEMS:  Positive for chest pain, shortness of breath.  Patient  unable to cooperate with the remainder of review of systems.   PHYSICAL EXAMINATION:  VITAL SIGNS:  She is afebrile.  Heart rate 63,  respirations 16, blood pressure 100/65, pulse ox 97% on 2 L/minute.  GENERAL:  She is currently sleeping and is AAO x3 on arousal, in no acute  distress and pleasant, but often nods back off to sleep.  NECK:  Normal carotid upstrokes.  No bruits, JVD.  LUNGS:  Respirations regular, unlabored, clear to auscultation.  CARDIAC:  Regular S1, S2.  No S3, S4, or murmurs.  ABDOMEN:  Round,  soft, nontender, nondistended.  Bowel sounds present x4.  EXTREMITIES:  Warm, dry, pink.  No clubbing, cyanosis, edema.  Dorsalis  pedis, posterior tibial pulses 2+ and equal bilaterally.  The right groin  site which is used for catheterization is clear of bleeding, bruising, or  hematoma.   Chest x-ray is pending.  EKG post PCI shows a heart rate of 69 in sinus  rhythm with a normal axis.  She has 1 mm ST elevation, 3 in aVF with T-wave  inversion.  She has 0.5-1 mm ST segment depression, 1 in aVL, V2-V6.   LABORATORIES:  Hemoglobin 14.8, hematocrit 42.8, WBC 11.8, platelets 168.  Sodium 134, potassium 3.2, chloride 102, CO2 27, BUN 8, creatinine 0.7,  glucose 174.  CK 124, MB 10.6, troponin I 0.29.  PTT 23.2, PT 10.9, INR 0.9.   ASSESSMENT/PLAN:  1.  Acute inferior ST elevation myocardial infarction.  She is status post      PCI and stenting of the mid and distal right coronary artery with two      bare metal stents.  She is currently pain-free and vital signs are      stable on dopamine.   Will check a tox screen as she is quite lethargic.      Will add aspirin, Statin, Plavix.  Hold off on beta blocker and ACE      inhibitor until off dopamine if blood pressure and heart rate allow.  2.  Polysubstance abuse.  Cessation advised.  Cessation and social work      consults pending.      Ok Anis, NP      Salvadore Farber, M.D. [facility]  Electronically Signed    CRB/MEDQ  D:  05/06/2005  T:  05/07/2005  Job:  [id number]

## 2010-10-02 NOTE — Discharge Summary (Signed)
[name], Simmons              ACCOUNT NO.:  [id number]   MEDICAL RECORD NO.:  [id number]          PATIENT TYPE:  IPS   LOCATION:  0501                          [facility]ILITY[facility]  [facility]   PHYSICIAN:  Anselm Jungling, MD  DATE OF BIRTH:  [birth year]   DATE OF ADMISSION:  11/21/2004  DATE OF DISCHARGE:  11/25/2004                                 DISCHARGE SUMMARY   IDENTIFYING DATA AND REASON FOR ADMISSION:  This the first [facility] admission for  [name], a 45 year old divorced white female, and mother of young child.  She  was admitted after [facility] in [place], requesting  assistance for detoxification from alcohol and cocaine abuse.   HISTORY OF THE PRESENTING PROBLEMS:  The patient had apparently had a long  history of substance abuse, and had gone through alcohol rehabilitation  treatment some 20+ years earlier.  The patient was a poor historian, but  admitted to alcohol abuse, and cocaine abuse.  In addition, she had been  taking Xanax prescribed on a regular basis for her.  She indicated that she  had been trying to taper her use of Xanax,  but she felt that this simply  led to her using more alcohol instead.   The patient was on no psychotropic medications at the time of her admission.   INITIAL DIAGNOSTIC IMPRESSION:  AXIS I:  Polysubstance abuse, alcohol and  cocaine dependence, and rule out major depressive disorder.  AXIS II:  Deferred.  AXIS III:  Status post cardiovascular accident and history of hypertension.  AXIS IV:  Stressors, severe.  AXIS V:  Global assessment of function on admission 35.   MEDICAL, LABORATORY AND PHYSICAL FEATURES:  The patient was medically  cleared in the emergency department prior to transfer to the inpatient  psychiatric service for further treatment.  A TSH level was within normal  limits.  The patient had an x-ray of her foot because she claimed that she  had had a puncture wound to the sole of her foot sometime prior to  admission.  She stated that she had the sensation of a foreign body in it.  The x-ray revealed no foreign body.  Her foot wound was treated with  Betadine soaks twice daily.  There were no sequelae to her foot injury.   HOSPITAL COURSE:  The patient was admitted to the adult inpatient service  where she participated in various therapeutic groups, activities and classes  designed to help her acquire better coping skills, a better understanding of  her underlying disorders and dynamics, and the development of a sobriety  plan.  She was placed on the Librium withdrawal protocol.  The patient  presented as a poorly organized, somewhat unsophisticated woman who looked  tired and depressed but stated that my mood is not that bad.  She denied  suicidal ideation.  Initially, she was quite focused on back and foot pain,  and was reluctant to participate in groups and activities.  Over the course  of her brief inpatient stay, however, she was up and out of bed more of the  time and began attending groups.  At no time during her inpatient stay did  she have any suicidal ideation, homicidal ideation, auditory hallucinations,  or other indicators of psychosis.   Case management staff was in touch with CPS worker Carrie Mew during her  stay.  It was indicated that [name] was probably homeless.  Her young child  was residing with her sister.   At the time of discharge, the patient was in better spirits and appeared to  be somewhat better organized.   AFTERCARE PLANS:  The patient was discharged on no medications.  She was to  follow up with [facility], where she was to have an  intake appointment with Beaulah Dinning on November 30, 2004.  She committed to  the idea of remaining sober, and using no alcohol, cocaine, and no  benzodiazepines either.   DISCHARGE DIAGNOSES:  AXIS I:  Polysubstance abuse, in early remission.  AXIS II:  Deferred.  AXIS III:  Status post cardiovascular  accident and hypertension.  AXIS IV:  Stressors, severe.  AXIS V:  Global assessment of function on discharge 55.     _______________    SPB/MEDQ  D:  12/15/2004  T:  12/15/2004  Job:  [id number]

## 2010-10-02 NOTE — Discharge Summary (Signed)
[name], [name]              ACCOUNT NO.:  [id number]   MEDICAL RECORD NO.:  [id number]          PATIENT TYPE:  INP   LOCATION:  2912                         FACILITY:  [facility]   PHYSICIAN:  Doylene Canning. Ladona Ridgel, M.D.  DATE OF BIRTH:  [birth year]   DATE OF ADMISSION:  05/06/2005  DATE OF DISCHARGE:  05/09/2005                                 DISCHARGE SUMMARY   PRIMARY CARE PHYSICIAN:  None.   CHIEF COMPLAINT:  Acute inferior wall myocardial infarction.   ALLERGIES:  NO KNOWN DRUG ALLERGIES.   PROCEDURE:  Left heart catheterization, left ventriculogram, coronary  angiogram, stent x2 to the RCA with bare metal stents on May 06, 2005.   SECONDARY DIAGNOSIS:  Polysubstance abuse with cocaine daily and tobacco and  alcohol.   HISTORY OF PRESENT ILLNESS:  The patient is a 45 year old white female with  no past history of CAD but a positive history of polysubstance abuse.  She  was admitted on May 06, 2005 with EKG changes in the inferior leads and  positive cardiac enzymes.  The patient was taken emergently to the  catheterization lab on May 06, 2005 where she had successful PCI of two  lesions in her RCA with bare metal stents.  Blood pressure during that time  remained quite low despite the patient's increased LVEDP, and dopamine was  initiated.  The patient did have shock post intervention and remained on  dopamine which was weaned on May 08, 2005.  Dr. Ladona Ridgel saw the patient  on May 09, 2005.  The patient was chest pain-free off of dopamine, now  normotensive on May 09, 2005.  The patient was ambulating in the halls  and tolerated well.  She is planned to be discharged home today.   DISCHARGE LABORATORY DATA:  White blood cell count 10.4, hemorrhoid 11.9,  hematocrit 33.4, platelets 173.  Sodium 137, potassium 3.1, glucose 114.  Cholesterol 167, triglycerides 84, HDL 49, LDL 101.  Thyroid studies show a  T4 of 0.95 and a TSH of 0.400.  EKG on May 07, 2005 showed a sinus  rhythm with a short PR and an inferior infarct with a possible acute  posterior injury also.   DISCHARGE MEDICATIONS:  1.  Plavix 75 mg p.o. daily x30 days.  2.  Aspirin 325 mg p.o. daily.  3.  Lipitor 40 mg p.o. q.Simmons.s.  4.  Enalapril 325 mg p.o. daily.   ACTIVITY:  The patient was also given an activity sheet which limited  lifting for 48 hours and no tub baths for a week.   FOLLOW UP:  The patient was also told to follow up with [facility], and she was given the number and prescription assistance  upon discharge.   DURATION OF ENCOUNTER:  Less than 30 minutes with M.D. and N.P. time.     ______________________________  April Humphrey, NP    ______________________________  Doylene Canning. Ladona Ridgel, M.D.    AH/MEDQ  D:  05/09/2005  T:  05/11/2005  Job:  [id number]   cc:   Doylene Canning. Ladona Ridgel, M.D.  [address]  [address]  [address]  [place]  [place] [address]

## 2010-10-02 NOTE — H&P (Signed)
NAMESHAREL, BEHNE              ACCOUNT NO.:  [id number]   MEDICAL RECORD NO.:  [id number]          PATIENT TYPE:  IPS   LOCATION:  0501                          FACILITY:  [facility]   PHYSICIAN:  Geoffery Lyons, M.D.      DATE OF BIRTH:  Sep 15, 1965   DATE OF ADMISSION:  11/21/2004  DATE OF DISCHARGE:                         PSYCHIATRIC ADMISSION ASSESSMENT   This is a voluntary admission.   IDENTIFYING INFORMATION:  This is a 45 year old divorced white female.  Apparently she presented to mental health in [place] requesting help for  detoxification from alcohol and cocaine.  She was told to come to the [facility] Emergency Department for detox, then return to [facility] for intensive  outpatient treatment once initially detoxed here at [facility].  She  presented to the emergency department yesterday.  Her CWAS was 15.  Her  urine drug screen was indeed positive for cocaine and benzos.  Her alcohol  level was 183.   PAST PSYCHIATRIC HISTORY:  She states she underwent withdrawal treatment for  alcohol 23-24 years ago one time in [place].  She denies any other  treatment.  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  Her mother unfortunately  died 2 years ago.  She has 3 children, a 76 year old daughter, an 18-year-  old son and 13-year-old daughter.  Apparently the youngest daughter is  currently living with her brother and his wife.   FAMILY HISTORY:  She states her brother has alcohol issues.   ALCOHOL AND DRUG HISTORY:  She began drinking at age 79.  Currently she is  drinking a 12-pack of beer.  She has been doing cocaine daily for the past 5  years.   PRIMARY CARE [facility]ia [facility] [place].   MEDICAL PROBLEMS:  She states that she is status post a CVA approximately 2  years ago.  She could not speak.  She has no motor deficits, no residual  motor deficits apparently.  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  She  never picked up her antihypertensive medication.   MEDICATIONS:  She states that she gets Xanax from other people.  Originally  it was prescribed for her post stroke but no recent prescriptions.   DRUG ALLERGIES:  No known drug allergies.   PHYSICAL EXAMINATION:  Vital signs in the emergency room were pulse 86-88,  respirations 16-18, blood pressure was 106/66 sitting and lying it was  146/96.  She also has an ulcer under the metatarsal of the right great toe  on the sole side of the foot.  She states she did this about a week ago.  She is afraid there still might be a piece of metal in there.  She says that  she is current with her tetanus shots, that she received one a few months  ago after being bitten by a horse on her chest.  There was a scar there.  She states that she has proof of this in her wallet.  We can also get her  records from  [place] to document her stroke, etc.   MENTAL STATUS EXAM:  She is drowsy.  She is oriented x 1.  Her appearance is  disheveled.  She is, otherwise, appropriately groomed and nourished.  She  does have an ulcer on the bottom of her right foot; however, she has been  tending to it.  Her speech is somewhat slurred.  Her mood is irritable,  demanding.  Her thought processes are somewhat clear.  She wants her  disability started; that is her goal.  Her judgment and insight are poor.  Concentration and memory are poor.  Her intelligence seems to be at least  average.  She reports issues with memory.  Indeed she did know the year, but  first she thought the President was Lysle Dingwall, then said [place]  She denies  suicidal or homicidal ideation.  When asked about auditory hallucinations,  she states that sometimes she thinks people have said things, but they say  they have not.   ADMISSION DIAGNOSES:   AXIS I:  1.  Polysubstance dependence.  2.  Alcohol and cocaine.  3.  Rule out major depressive disorder.   AXIS II:  Rule out  personality disorder.   AXIS III:  1.  Status post cerebrovascular accident with no residual motor deficits.  2.  Apparently she is somewhat hypertensive but not taking any medications.   AXIS IV:  Severe problems with primary support group, occupation, and  problems related to legal system.  She currently does not have a driver's  license.  She has had 4-5 DUIs.   AXIS V:  34.   PLAN:  Admit for detoxification.  Toward that end we will put her on low-  dose Librium protocol as well as the clonidine protocol.  We will assess her  again in 48 hours once she clears up some where antidepressive medications  are indicated.  We will get her records from [facility] to address the ulcer  on her foot and her history for CVA.       MD/MEDQ  D:  11/22/2004  T:  11/22/2004  Job:  [id number]

## 2010-10-02 NOTE — Cardiovascular Report (Signed)
[name], [name]              ACCOUNT NO.:  [id number]   MEDICAL RECORD NO.:  [id number]          PATIENT TYPE:  INP   LOCATION:  2912                         FACILITY:  [facility]   PHYSICIAN:  Salvadore Farber, M.D. LHCDATE OF BIRTH:  1965-12-01   DATE OF PROCEDURE:  05/06/2005  DATE OF DISCHARGE:                              CARDIAC CATHETERIZATION   PROCEDURE:  Left heart catheterization, left ventriculography, coronary  angiography, bare-metal stent x2 to the RCA, Starclose closure of right  common femoral arteriotomy site, placement of temporary RV pacemaker wire.   INDICATIONS:  [name] is a 45 year old woman who used cocaine last  evening and then developed the onset of severe substernal chest discomfort  approximately 9 p.m. She has no prior history of cardiac disease but does  report a history of stroke. She can give no details on this. She presented  to [facility] Emergency Room at approximately 4:20 this morning. First  electrocardiogram was done at 5:30. Transfer request was made at 6:30. She  arrived here at 7:28 with ongoing chest pain and ST elevations. We decided  to proceed with emergent cardiac catheterization with an eye to percutaneous  coronary revascularization.   PROCEDURE TECHNIQUE:  Informed consent was obtained. Under 1% lidocaine  local anesthesia, a 6-French sheath was placed in the right common femoral  artery using modified Seldinger technique. Diagnostic angiography of the  left system was performed using JL-4. I then proceeded to place a 6-French  AL-1 guide in the ostium of the RCA. This has a somewhat anterior takeoff.  Angiography by hand injection demonstrated occlusion of the vessel just  before the bifurcation into the PDA and PLV. There was also an 80% stenosis  in the midvessel. I advanced a Prowater wire across the occlusion.   Which the patient received heparin, aspirin, 300 milligrams of Plavix prior  to transfer. We gave an additional  300 milligrams of Plavix, additional  heparin to achieve and maintain an ACT of greater than 200 seconds, and  double bolus eptifibatide.   I then advanced a Prowater wire across the lesion without difficulty. I  began by predilating the occlusion using a 2.5 x 12 mm Maverick for two  inflations at 6 atmospheres. This restored TIMI III flow to the distal  vasculature. I then proceeded to stent the distal vessel.   With this, the patient became even more hypotensive then she had when she  arrived and even more bradycardic. I therefore placed a 6-French venous  sheath and advanced a pacing wire across into the right ventricle. Pacing  was commenced. Dopamine was administered to maintain blood pressure.   I then stented the distal lesion using a 2.25 x 18 mm mini Vision deployed  at 14 atmospheres. With this, TIMI III flow was maintained. I then  administered 100 mcg of intracoronary nitroglycerin. This had no meaningful  impact on the stenosis of the midvessel. I therefore decided to stent this.  A 2.5 x 18 mm mini Vision was again deployed at 14 atmospheres. Final  angiography demonstrated no residual stenosis, no dissection, and TIMI III  flow  to the distal vasculature.   Pigtail catheter was then advanced over wire and positioned in the left  ventricle. Pressures were measured and ventriculography performed. Finally,  the arteriotomy was closed using a Starclose device. Complete hemostasis was  obtained. The pacing wire was removed and she was transferred to the holding  room in stable condition having tolerated the procedure well. Upon transfer,  she remained on dopamine at 18 mcg/kg per minute.   COMPLICATIONS:  None.   FINDINGS:  1.  LV: 89/23/30. EF 70% with inferior akinesis.  2.  No aortic stenosis or mitral regurgitation.  3.  Left main:  Relatively long vessel with a focal 30% stenosis distally.  4.  LAD:  Large vessel giving rise to two large diagonals which both arise       proximally. It is angiographically normal.  5.  Circumflex:  Small vessel giving rise to a single obtuse marginal. It is      angiographically normal.  6.  RCA:  Moderate-sized dominant vessel. There was an 80% stenosis of the      midvessel and a total occlusion of the distal vessel. Both were treated      with bare-metal stents to no residual.   IMPRESSION/RECOMMENDATIONS:  1.  Successful percutaneous revascularization of the occluded right coronary      artery using two bare-metal stents for two separate lesions. Her blood      pressure remains quite low despite markedly elevated left ventricular      end-diastolic pressure. I suspect Bezold-Jarisch reflex. We will      maintain dopamine to maintain systolic blood pressure greater than 90.  2.  She has received Plavix here and should be ideally maintained on this      for a year but certainly for a minimum of 30 days. Aspirin should be      continued indefinitely. Beta blockers are contraindicated due to her      cocaine use. We will withhold ACE inhibitor at present due to her      hypotension. We will initiate high-dose Statin. Cessation of cocaine      abuse strongly advised.      Salvadore Farber, M.D. [facility]  Electronically Signed     WED/MEDQ  D:  05/06/2005  T:  05/07/2005  Job:  [id number]

## 2010-11-16 ENCOUNTER — Other Ambulatory Visit: Payer: Self-pay | Admitting: Orthopedic Surgery

## 2010-11-17 ENCOUNTER — Other Ambulatory Visit: Payer: Self-pay | Admitting: Orthopedic Surgery

## 2010-11-17 DIAGNOSIS — R52 Pain, unspecified: Secondary | ICD-10-CM

## 2010-12-10 ENCOUNTER — Telehealth: Payer: Self-pay | Admitting: Cardiology

## 2010-12-10 NOTE — Telephone Encounter (Signed)
LOV,Labs,Echo faxed to [facility] @ [phone]   12/10/10/km

## 2011-02-09 LAB — URINALYSIS, ROUTINE W REFLEX MICROSCOPIC
Bilirubin Urine: NEGATIVE
Glucose, UA: NEGATIVE
Ketones, ur: NEGATIVE
Specific Gravity, Urine: 1.02
pH: 5.5

## 2011-02-09 LAB — CBC
Hemoglobin: 14.8
MCHC: 34.5
MCV: 93.8
RBC: 4.58
WBC: 9.4

## 2011-02-09 LAB — COMPREHENSIVE METABOLIC PANEL
ALT: 49 — ABNORMAL HIGH
AST: 33
CO2: 29
Calcium: 9.3
Chloride: 100
Creatinine, Ser: 0.71
GFR calc non Af Amer: >60
Glucose, Bld: 87
Sodium: 133 — ABNORMAL LOW
Total Bilirubin: 0.6

## 2011-02-09 LAB — DIFFERENTIAL
Basophils Absolute: 0.1
Eosinophils Absolute: 0.1
Eosinophils Relative: 1
Lymphs Abs: 3.2
Neutrophils Relative %: 56

## 2011-02-15 ENCOUNTER — Emergency Department (HOSPITAL_COMMUNITY): Payer: Medicaid Other

## 2011-02-15 ENCOUNTER — Encounter: Payer: Self-pay | Admitting: *Deleted

## 2011-02-15 ENCOUNTER — Other Ambulatory Visit: Payer: Self-pay

## 2011-02-15 ENCOUNTER — Emergency Department (HOSPITAL_COMMUNITY)
Admission: EM | Admit: 2011-02-15 | Discharge: 2011-02-15 | Disposition: A | Discharge status: Home / Self Care | Payer: Medicaid Other | Attending: Emergency Medicine | Admitting: Emergency Medicine

## 2011-02-15 DIAGNOSIS — F17200 Nicotine dependence, unspecified, uncomplicated: Secondary | ICD-10-CM | POA: Insufficient documentation

## 2011-02-15 DIAGNOSIS — I2510 Atherosclerotic heart disease of native coronary artery without angina pectoris: Secondary | ICD-10-CM | POA: Insufficient documentation

## 2011-02-15 DIAGNOSIS — R079 Chest pain, unspecified: Secondary | ICD-10-CM | POA: Insufficient documentation

## 2011-02-15 DIAGNOSIS — Z8673 Personal history of transient ischemic attack (TIA), and cerebral infarction without residual deficits: Secondary | ICD-10-CM | POA: Insufficient documentation

## 2011-02-15 DIAGNOSIS — Z780 Asymptomatic menopausal state: Secondary | ICD-10-CM

## 2011-02-15 DIAGNOSIS — R51 Headache: Secondary | ICD-10-CM | POA: Insufficient documentation

## 2011-02-15 DIAGNOSIS — N951 Menopausal and female climacteric states: Secondary | ICD-10-CM | POA: Insufficient documentation

## 2011-02-15 DIAGNOSIS — Z7982 Long term (current) use of aspirin: Secondary | ICD-10-CM | POA: Insufficient documentation

## 2011-02-15 DIAGNOSIS — F430 Acute stress reaction: Secondary | ICD-10-CM | POA: Insufficient documentation

## 2011-02-15 HISTORY — DX: Cerebral infarction, unspecified: I63.9

## 2011-02-15 HISTORY — DX: Atherosclerotic heart disease of native coronary artery without angina pectoris: I25.10

## 2011-02-15 LAB — BASIC METABOLIC PANEL
BUN: 13 mg/dL (ref 6–23)
CO2: 26 mEq/L (ref 19–32)
Chloride: 102 mEq/L (ref 96–112)
Creatinine, Ser: 0.61 mg/dL (ref 0.50–1.10)
Potassium: 3.6 mEq/L (ref 3.5–5.1)

## 2011-02-15 LAB — URINALYSIS, ROUTINE W REFLEX MICROSCOPIC
Glucose, UA: NEGATIVE mg/dL
Hgb urine dipstick: NEGATIVE
Ketones, ur: NEGATIVE mg/dL
Leukocytes, UA: NEGATIVE
Protein, ur: NEGATIVE mg/dL
Urobilinogen, UA: 1 mg/dL (ref 0.0–1.0)

## 2011-02-15 LAB — CBC
HCT: 43.7 % (ref 36.0–46.0)
Hemoglobin: 14.7 g/dL (ref 12.0–15.0)
MCV: 94.2 fL (ref 78.0–100.0)
RBC: 4.64 MIL/uL (ref 3.87–5.11)
WBC: 8.4 10*3/uL (ref 4.0–10.5)

## 2011-02-15 LAB — CARDIAC PANEL(CRET KIN+CKTOT+MB+TROPI)
Relative Index: INVALID (ref 0.0–2.5)
Troponin I: <0.3 ng/mL (ref <0.30)

## 2011-02-15 LAB — DIFFERENTIAL
Basophils Relative: 1 % (ref 0–1)
Eosinophils Absolute: 0.1 10*3/uL (ref 0.0–0.7)
Eosinophils Relative: 2 % (ref 0–5)
Lymphs Abs: 3.3 10*3/uL (ref 0.7–4.0)
Monocytes Relative: 9 % (ref 3–12)
Neutrophils Relative %: 50 % (ref 43–77)

## 2011-02-15 LAB — POCT I-STAT TROPONIN I: Troponin i, poc: 0.04 ng/mL (ref 0.00–0.08)

## 2011-02-15 NOTE — ED Provider Notes (Addendum)
History     CSN: [id number] Arrival date & time: 02/15/2011 12:20 AM  Chief Complaint  Patient presents with  . Anxiety  . Chest Pain    (Consider location/radiation/quality/duration/timing/severity/associated sxs/prior treatment) HPI Comments: Seen 0032. Patient denies fever, chills, myalgias, chest pain, nausea, vomiting, abdominal pain.  Patient is a 45 y.o. female presenting with anxiety and chest pain. The history is provided by the patient (Patient with h/o hot flashes x several months that make her feel anxious.).  Anxiety This is a new (Last mentrual period was 11/2010. She has had hot flashes for months, increased recently. Unable to sleep well. Her head and neck turn red and feel like they are burning. ) problem. The current episode started more than 1 week ago. The problem occurs daily. The problem has been gradually worsening. Associated symptoms include headaches. Pertinent negatives include no chest pain, no abdominal pain and no shortness of breath. The symptoms are aggravated by nothing. The symptoms are relieved by ice.  Chest Pain Pertinent negatives for primary symptoms include no shortness of breath and no abdominal pain.     Past Medical History  Diagnosis Date  . Coronary artery disease   . Stroke     Past Surgical History  Procedure Date  . Coronary stent placement     No family history on file.  History  Substance Use Topics  . Smoking status: Current Some Day Smoker  . Smokeless tobacco: Not on file  . Alcohol Use: Yes    OB History    Grav Para Term Preterm Abortions TAB SAB Ect Mult Living                  Review of Systems  Respiratory: Negative for shortness of breath.   Cardiovascular: Negative for chest pain.  Gastrointestinal: Negative for abdominal pain.  Neurological: Positive for headaches.  All other systems reviewed and are negative.    Allergies  Review of patient's allergies indicates no known allergies.  Home  Medications   Current Outpatient Rx  Name Route Sig Dispense Refill  . ASPIRIN 325 MG PO TBEC Oral Take 325 mg by mouth daily.      Marland Kitchen PRAVASTATIN SODIUM PO Oral Take by mouth 1 day or 1 dose.      Marland Kitchen ACCUPRIL PO Oral Take by mouth 1 day or 1 dose.      Marland Kitchen TRAMADOL HCL 50 MG PO TABS  TAKE 1 TABLET BY MOUTH EVERY 6 HOURS. MUST LAST 15  DAYS. 30 tablet 0    BP 108/70  Pulse 72  Temp(Src) 97.9 F (36.6 C) (Oral)  Resp 18  Ht 5\' 2"  (1.575 m)  Wt 159 lb (72.122 kg)  BMI 29.08 kg/m2  SpO2 99%  LMP 11/15/2010  Physical Exam  Nursing note and vitals reviewed. Constitutional: She is oriented to person, place, and time. She appears well-developed and well-nourished.  HENT:  Head: Normocephalic and atraumatic.  Eyes: EOM are normal.  Neck: Normal range of motion. Neck supple.  Cardiovascular: Normal rate, normal heart sounds and intact distal pulses.   Pulmonary/Chest: Effort normal and breath sounds normal.  Abdominal: Soft. Bowel sounds are normal.  Musculoskeletal: Normal range of motion.  Neurological: She is alert and oriented to person, place, and time.  Skin: Skin is warm and dry.    ED Course  Procedures (including critical care time) Results for orders placed during the hospital encounter of 02/15/11  CBC      Component Value Range  WBC 8.4  4.0 - 10.5 (K/uL)   RBC 4.64  3.87 - 5.11 (MIL/uL)   Hemoglobin 14.7  12.0 - 15.0 (g/dL)   HCT 95.6  21.3 - 08.6 (%)   MCV 94.2  78.0 - 100.0 (fL)   MCH 31.7  26.0 - 34.0 (pg)   MCHC 33.6  30.0 - 36.0 (g/dL)   RDW 57.8  46.9 - 62.9 (%)   Platelets 209  150 - 400 (K/uL)  BASIC METABOLIC PANEL      Component Value Range   Sodium 136  135 - 145 (mEq/L)   Potassium 3.6  3.5 - 5.1 (mEq/L)   Chloride 102  96 - 112 (mEq/L)   CO2 26  19 - 32 (mEq/L)   Glucose, Bld 105 (*) 70 - 99 (mg/dL)   BUN 13  6 - 23 (mg/dL)   Creatinine, Ser 5.28  0.50 - 1.10 (mg/dL)   Calcium 9.4  8.4 - 41.3 (mg/dL)   GFR calc non Af Amer >90  >90 (mL/min)    GFR calc Af Amer >90  >90 (mL/min)  CARDIAC PANEL(CRET KIN+CKTOT+MB+TROPI)      Component Value Range   Total CK 63  7 - 177 (U/L)   CK, MB 2.4  0.3 - 4.0 (ng/mL)   Troponin I <0.30  <0.30 (ng/mL)   Relative Index RELATIVE INDEX IS INVALID  0.0 - 2.5   POCT I-STAT TROPONIN I      Component Value Range   Troponin i, poc 0.04  0.00 - 0.08 (ng/mL)   Comment 3           URINALYSIS, ROUTINE W REFLEX MICROSCOPIC      Component Value Range   Color, Urine YELLOW  YELLOW    Appearance CLEAR  CLEAR    Specific Gravity, Urine 1.020  1.005 - 1.030    pH 6.5  5.0 - 8.0    Glucose, UA NEGATIVE  NEGATIVE (mg/dL)   Hgb urine dipstick NEGATIVE  NEGATIVE    Bilirubin Urine NEGATIVE  NEGATIVE    Ketones, ur NEGATIVE  NEGATIVE (mg/dL)   Protein, ur NEGATIVE  NEGATIVE (mg/dL)   Urobilinogen, UA 1.0  0.0 - 1.0 (mg/dL)   Nitrite NEGATIVE  NEGATIVE    Leukocytes, UA NEGATIVE  NEGATIVE   DIFFERENTIAL      Component Value Range   Neutrophils Relative 50  43 - 77 (%)   Neutro Abs 4.2  1.7 - 7.7 (K/uL)   Lymphocytes Relative 40  12 - 46 (%)   Lymphs Abs 3.3  0.7 - 4.0 (K/uL)   Monocytes Relative 9  3 - 12 (%)   Monocytes Absolute 0.7  0.1 - 1.0 (K/uL)   Eosinophils Relative 2  0 - 5 (%)   Eosinophils Absolute 0.1  0.0 - 0.7 (K/uL)   Basophils Relative 1  0 - 1 (%)   Basophils Absolute 0.0  0.0 - 0.1 (K/uL)   Dg Chest Portable 1 View  02/15/2011  *RADIOLOGY REPORT*  Clinical Data: Chest pain and hot flashes.  PORTABLE CHEST - 1 VIEW  Comparison: 03/13/2010  Findings: Probable emphysematous changes in the upper lungs. The heart size and pulmonary vascularity are normal. The lungs appear clear and expanded without focal air space disease or consolidation. No blunting of the costophrenic angles.  No significant change since prior study.  IMPRESSION: No evidence of active pulmonary disease.  Original Report Authenticated By: Marlon Pel, M.D.    Patient with recent increase in hot  flashes and stress.  Feeling anxious. Labs unremarkable. Xray unremarkable. Spoke with patient and her boyfriend about menopause symptoms. She will follow up with her OB/GYN. MDM Reviewed: nursing note and vitals Interpretation: labs and x-ray     Date: 02/15/2011  0021  Rate:67  Rhythm: normal sinus rhythm  QRS Axis: normal  Intervals: normal  ST/T Wave abnormalities: normal  Conduction Disutrbances:none  Narrative Interpretation: inferior infarct cited on or before 05/06/2005  Old EKG Reviewed: unchanged from 05/13/2010         Nicoletta Dress. Colon Branch, MD 02/15/11 [id number]  Nicoletta Dress. Colon Branch, MD 02/15/11 [phone]

## 2011-02-15 NOTE — ED Notes (Signed)
Pt also reports intermittent episodes of chest pain worsening today

## 2011-02-15 NOTE — ED Notes (Signed)
E signature pad not working 

## 2011-02-15 NOTE — ED Notes (Signed)
Pt reports increased episodes of "hot flashes" with increased sweating, pt also reports increased stress

## 2011-05-12 ENCOUNTER — Emergency Department (HOSPITAL_COMMUNITY): Payer: No Typology Code available for payment source

## 2011-05-12 ENCOUNTER — Encounter (HOSPITAL_COMMUNITY): Payer: Self-pay | Admitting: Emergency Medicine

## 2011-05-12 ENCOUNTER — Emergency Department (HOSPITAL_COMMUNITY)
Admission: EM | Admit: 2011-05-12 | Discharge: 2011-05-12 | Disposition: A | Discharge status: Home / Self Care | Payer: No Typology Code available for payment source | Attending: Emergency Medicine | Admitting: Emergency Medicine

## 2011-05-12 DIAGNOSIS — Z8673 Personal history of transient ischemic attack (TIA), and cerebral infarction without residual deficits: Secondary | ICD-10-CM | POA: Insufficient documentation

## 2011-05-12 DIAGNOSIS — Z79899 Other long term (current) drug therapy: Secondary | ICD-10-CM | POA: Insufficient documentation

## 2011-05-12 DIAGNOSIS — S139XXA Sprain of joints and ligaments of unspecified parts of neck, initial encounter: Secondary | ICD-10-CM | POA: Insufficient documentation

## 2011-05-12 DIAGNOSIS — F329 Major depressive disorder, single episode, unspecified: Secondary | ICD-10-CM | POA: Insufficient documentation

## 2011-05-12 DIAGNOSIS — Z7982 Long term (current) use of aspirin: Secondary | ICD-10-CM | POA: Insufficient documentation

## 2011-05-12 DIAGNOSIS — F3289 Other specified depressive episodes: Secondary | ICD-10-CM | POA: Insufficient documentation

## 2011-05-12 DIAGNOSIS — S161XXA Strain of muscle, fascia and tendon at neck level, initial encounter: Secondary | ICD-10-CM

## 2011-05-12 DIAGNOSIS — F17200 Nicotine dependence, unspecified, uncomplicated: Secondary | ICD-10-CM | POA: Insufficient documentation

## 2011-05-12 DIAGNOSIS — M545 Low back pain, unspecified: Secondary | ICD-10-CM | POA: Insufficient documentation

## 2011-05-12 DIAGNOSIS — S335XXA Sprain of ligaments of lumbar spine, initial encounter: Secondary | ICD-10-CM | POA: Insufficient documentation

## 2011-05-12 DIAGNOSIS — M79609 Pain in unspecified limb: Secondary | ICD-10-CM | POA: Insufficient documentation

## 2011-05-12 DIAGNOSIS — I2510 Atherosclerotic heart disease of native coronary artery without angina pectoris: Secondary | ICD-10-CM | POA: Insufficient documentation

## 2011-05-12 DIAGNOSIS — S39012A Strain of muscle, fascia and tendon of lower back, initial encounter: Secondary | ICD-10-CM

## 2011-05-12 HISTORY — DX: Major depressive disorder, single episode, unspecified: F32.9

## 2011-05-12 HISTORY — DX: Depression, unspecified: F32.A

## 2011-05-12 MED ORDER — ONDANSETRON 4 MG PO TBDP
4.0000 mg | ORAL_TABLET | Freq: Once | ORAL | Status: AC
  Administered 2011-05-12: 4 mg via ORAL
  Filled 2011-05-12: qty 1

## 2011-05-12 MED ORDER — HYDROCODONE-ACETAMINOPHEN 5-325 MG PO TABS
1.0000 | ORAL_TABLET | Freq: Once | ORAL | Status: AC
  Administered 2011-05-12: 1 via ORAL
  Filled 2011-05-12: qty 1

## 2011-05-12 MED ORDER — HYDROCODONE-ACETAMINOPHEN 5-325 MG PO TABS: 1.0000 | ORAL_TABLET | Freq: Four times a day (QID) | ORAL | Status: AC | PRN

## 2011-05-12 MED ORDER — IBUPROFEN 800 MG PO TABS
800.0000 mg | ORAL_TABLET | Freq: Once | ORAL | Status: AC
  Administered 2011-05-12: 800 mg via ORAL
  Filled 2011-05-12: qty 1

## 2011-05-12 MED ORDER — ONDANSETRON HCL 4 MG PO TABS: 4.0000 mg | ORAL_TABLET | Freq: Four times a day (QID) | ORAL | Status: AC

## 2011-05-12 NOTE — ED Notes (Signed)
Patient c/o sore throat and left ear ache after being placed in room. EDPA aware.
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
## 2011-05-12 NOTE — ED Provider Notes (Signed)
Medical screening examination/treatment/procedure(s) were performed by non-physician practitioner and as supervising physician I was immediately available for consultation/collaboration.   Barbie Croston L Breylin Dom, MD 05/12/11 1800 
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
## 2011-08-24 ENCOUNTER — Ambulatory Visit: Payer: No Typology Code available for payment source | Admitting: Adult Health

## 2012-02-12 ENCOUNTER — Emergency Department (HOSPITAL_COMMUNITY)
Admission: EM | Admit: 2012-02-12 | Discharge: 2012-02-12 | Disposition: A | Discharge status: Home / Self Care | Payer: Medicaid Other | Attending: Emergency Medicine | Admitting: Emergency Medicine

## 2012-02-12 ENCOUNTER — Encounter (HOSPITAL_COMMUNITY): Payer: Self-pay | Admitting: *Deleted

## 2012-02-12 DIAGNOSIS — I2510 Atherosclerotic heart disease of native coronary artery without angina pectoris: Secondary | ICD-10-CM | POA: Insufficient documentation

## 2012-02-12 DIAGNOSIS — Z825 Family history of asthma and other chronic lower respiratory diseases: Secondary | ICD-10-CM | POA: Insufficient documentation

## 2012-02-12 DIAGNOSIS — H16001 Unspecified corneal ulcer, right eye: Secondary | ICD-10-CM

## 2012-02-12 DIAGNOSIS — I252 Old myocardial infarction: Secondary | ICD-10-CM | POA: Insufficient documentation

## 2012-02-12 DIAGNOSIS — H16009 Unspecified corneal ulcer, unspecified eye: Secondary | ICD-10-CM | POA: Insufficient documentation

## 2012-02-12 DIAGNOSIS — F17200 Nicotine dependence, unspecified, uncomplicated: Secondary | ICD-10-CM | POA: Insufficient documentation

## 2012-02-12 DIAGNOSIS — F329 Major depressive disorder, single episode, unspecified: Secondary | ICD-10-CM | POA: Insufficient documentation

## 2012-02-12 DIAGNOSIS — Z809 Family history of malignant neoplasm, unspecified: Secondary | ICD-10-CM | POA: Insufficient documentation

## 2012-02-12 DIAGNOSIS — Z8673 Personal history of transient ischemic attack (TIA), and cerebral infarction without residual deficits: Secondary | ICD-10-CM | POA: Insufficient documentation

## 2012-02-12 DIAGNOSIS — F3289 Other specified depressive episodes: Secondary | ICD-10-CM | POA: Insufficient documentation

## 2012-02-12 DIAGNOSIS — Z823 Family history of stroke: Secondary | ICD-10-CM | POA: Insufficient documentation

## 2012-02-12 DIAGNOSIS — Z885 Allergy status to narcotic agent status: Secondary | ICD-10-CM | POA: Insufficient documentation

## 2012-02-12 DIAGNOSIS — Z8249 Family history of ischemic heart disease and other diseases of the circulatory system: Secondary | ICD-10-CM | POA: Insufficient documentation

## 2012-02-12 DIAGNOSIS — H5710 Ocular pain, unspecified eye: Secondary | ICD-10-CM | POA: Insufficient documentation

## 2012-02-12 HISTORY — DX: Acute myocardial infarction, unspecified: I21.9

## 2012-02-12 HISTORY — DX: Blindness, one eye, unspecified eye: H54.40

## 2012-02-12 MED ORDER — CYCLOPENTOLATE HCL 1 % OP SOLN: 1.0000 [drp] | Freq: Once | OPHTHALMIC | Status: DC

## 2012-02-12 MED ORDER — GATIFLOXACIN 0.5 % OP SOLN
1.0000 [drp] | Freq: Once | OPHTHALMIC | Status: AC
  Administered 2012-02-12: 1 [drp] via OPHTHALMIC
  Filled 2012-02-12: qty 2.5

## 2012-02-12 MED ORDER — OXYCODONE-ACETAMINOPHEN 5-325 MG PO TABS
1.0000 | ORAL_TABLET | Freq: Once | ORAL | Status: AC
  Administered 2012-02-12: 1 via ORAL
  Filled 2012-02-12: qty 1

## 2012-02-12 MED ORDER — KETOROLAC TROMETHAMINE 10 MG PO TABS: 10.0000 mg | ORAL_TABLET | Freq: Four times a day (QID) | ORAL | Status: DC | PRN

## 2012-02-12 MED ORDER — OXYCODONE-ACETAMINOPHEN 5-325 MG PO TABS: 1.0000 | ORAL_TABLET | Freq: Four times a day (QID) | ORAL | Status: DC | PRN

## 2012-02-12 MED ORDER — FLUORESCEIN SODIUM 1 MG OP STRP
1.0000 | ORAL_STRIP | Freq: Once | OPHTHALMIC | Status: AC
  Administered 2012-02-12: 1 via OPHTHALMIC
  Filled 2012-02-12: qty 1

## 2012-02-12 MED ORDER — TETRACAINE HCL 0.5 % OP SOLN
2.0000 [drp] | Freq: Once | OPHTHALMIC | Status: AC
  Administered 2012-02-12: 2 [drp] via OPHTHALMIC
  Filled 2012-02-12: qty 2

## 2012-02-12 NOTE — ED Notes (Signed)
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 Cold chills. Cold symptoms for several days. Pt states she was having pain to her chest this morning. Pt states her eye pain is the reason she is here today.

## 2012-02-12 NOTE — ED Provider Notes (Addendum)
History     CSN: [id number]  Arrival date & time 02/12/12  1037   First MD Initiated Contact with Patient 02/12/12 1326      Chief Complaint  Patient presents with  . Eye Pain  . Chills  . Chest Pain    (Consider location/radiation/quality/duration/timing/severity/associated sxs/prior treatment) HPI Comments: Ms. Saintil presents for evaluation of 10/10 right eye pain and redness. She states she was sweeping the porch yesterday and felt something pop into her eye.  She also states that she only intermittently wears her contact lenses and 2 days ago wore a pair that may have been "really old".  She denies any left eye pain or fever.  Patient is a 46 y.o. female presenting with eye pain and chest pain. The history is provided by the patient. No language interpreter was used.  Eye Pain This is a new problem. The current episode started yesterday. The problem occurs constantly. The problem has been rapidly worsening. Pertinent negatives include no chest pain, no abdominal pain, no headaches and no shortness of breath. Nothing aggravates the symptoms. Nothing relieves the symptoms.  Chest Pain Primary symptoms include cough. Pertinent negatives for primary symptoms include no fever, no fatigue, no shortness of breath, no palpitations, no abdominal pain, no nausea and no vomiting.     Past Medical History  Diagnosis Date  . Coronary artery disease   . Stroke   . Depression   . Blindness of right eye   . MI (myocardial infarction)     Past Surgical History  Procedure Date  . Coronary stent placement     Family History  Problem Relation Age of Onset  . Stroke Other   . Seizures Other   . Heart failure Other   . Cancer Other   . Asthma Other     History  Substance Use Topics  . Smoking status: Current Some Day Smoker -- 0.5 packs/day for 37 years    Types: Cigarettes  . Smokeless tobacco: Never Used  . Alcohol Use: Yes    OB History    Grav Para Term Preterm  Abortions TAB SAB Ect Mult Living   4 3 3  1  1   3       Review of Systems  Constitutional: Negative for fever, chills, activity change, appetite change and fatigue.  HENT: Positive for rhinorrhea and postnasal drip. Negative for ear pain, congestion, sore throat, facial swelling, trouble swallowing and neck pain.   Eyes: Positive for photophobia, pain, redness, itching and visual disturbance. Negative for discharge.  Respiratory: Positive for cough. Negative for apnea, chest tightness and shortness of breath.   Cardiovascular: Negative for chest pain and palpitations.  Gastrointestinal: Negative for nausea, vomiting, abdominal pain and diarrhea.  Genitourinary: Negative.   Musculoskeletal: Negative.   Skin: Negative.   Neurological: Negative.  Negative for headaches.  Psychiatric/Behavioral: Negative.     Allergies  Codeine  Home Medications   Current Outpatient Rx  Name Route Sig Dispense Refill  . ASPIRIN 325 MG PO TBEC Oral Take 325 mg by mouth daily.      Marland Kitchen LISINOPRIL 5 MG PO TABS Oral Take 5 mg by mouth daily.    Marland Kitchen PRAVASTATIN SODIUM 40 MG PO TABS Oral Take 40 mg by mouth daily.      BP 143/111  Pulse 79  Temp 97.7 F (36.5 C) (Oral)  Resp 16  Ht 5\' 2"  (1.575 m)  Wt 150 lb (68.04 kg)  BMI 27.44 kg/m2  SpO2 99%  LMP 05/05/2011  Physical Exam  Nursing note and vitals reviewed. Constitutional: She is oriented to person, place, and time. She appears well-developed and well-nourished. No distress.  HENT:  Head: Normocephalic and atraumatic.  Right Ear: External ear normal.  Left Ear: External ear normal.  Nose: Nose normal.  Mouth/Throat: Oropharynx is clear and moist. No oropharyngeal exudate.  Eyes: EOM and lids are normal. Pupils are equal, round, and reactive to light. Right eye exhibits chemosis. Right eye exhibits no discharge, no exudate and no hordeolum. No foreign body present in the right eye. Left eye exhibits no chemosis, no discharge, no exudate and no  hordeolum. No foreign body present in the left eye. Right conjunctiva is injected. Right conjunctiva has no hemorrhage. Left conjunctiva is injected. Left conjunctiva has no hemorrhage. No scleral icterus. Right eye exhibits normal extraocular motion and no nystagmus. Left eye exhibits normal extraocular motion and no nystagmus. Right pupil is round and reactive. Left pupil is round and reactive. Pupils are equal.  Fundoscopic exam:      The right eye shows no exudate and no hemorrhage.       The left eye shows no exudate and no hemorrhage.  Slit lamp exam:      The right eye shows corneal ulcer and fluorescein uptake. The right eye shows no corneal abrasion, no foreign body, no hyphema, no hypopyon and no anterior chamber bulge.       The left eye shows fluorescein uptake. The left eye shows no corneal abrasion, no foreign body, no hyphema, no hypopyon and no anterior chamber bulge.       Marked/severe right conjunctival injection and chemosis, mild left injection without chemosis.  Note several small punctate left corneal defects and no abrasions.  On the right, note corneal ulceration at the 7 o'clock position with other shallow defects (slightly larger than punctate) scattered between 11 and 6 o'clock position.  No abrasions noted.  Fundoscopic exam limited secondary to pt's discomfort.  Neck: Normal range of motion. Neck supple. No JVD present. No tracheal deviation present.  Cardiovascular: Normal rate, regular rhythm, normal heart sounds and intact distal pulses.  Exam reveals no gallop and no friction rub.   No murmur heard. Pulmonary/Chest: Effort normal and breath sounds normal. No stridor. No respiratory distress. She has no wheezes. She has no rales. She exhibits no tenderness.  Abdominal: Soft. Bowel sounds are normal. She exhibits no distension and no mass. There is no tenderness. There is no rebound and no guarding.  Musculoskeletal: Normal range of motion. She exhibits no edema and no  tenderness.  Lymphadenopathy:    She has no cervical adenopathy.  Neurological: She is alert and oriented to person, place, and time.  Skin: Skin is warm and dry. No rash noted. She is not diaphoretic. No erythema. No pallor.  Psychiatric: She has a normal mood and affect. Her behavior is normal.    ED Course  Procedures (including critical care time)  Labs Reviewed - No data to display No results found.   No diagnosis found.    MDM  Pt presents for evaluation of right eye pain.  She states it feels like something is stuck in her eye.  She had a FB sensation after sweeping yesterday.  Note marked right sided conjunctival injection with some chemosis.  Plan apply topical tetracaine, perform slit lamp exam.  She wears contact lenses and reports using a pain 2 days ago that had been sitting for some time.  She does  not recall if the solution in the lens cup was cloudy but is certain she didn't rinse them prior to putting them in.   1545.  Pt has evidence of a right corneal ulcer on slit lamp exam.  This is likely related to her contact lenses.  Some small punctate corneal defects noted on the left side also.  Discussed her hx and exam with Dr. Lita Mains (eye specialist in [place], [place]).  Plan d/c home on gatifloxacin gtts, cyclogyl, toradol, and percocet.  She has been instructed to call his office at 0800 on Monday, September 30, to arrange a same day appointment.  She has been instructed not to wear those lenses anymore and to discard the case they were stored in also.       Tobin Chad, MD 02/12/12 1554  Tobin Chad, MD 02/12/12 [phone]

## 2012-02-12 NOTE — ED Notes (Signed)
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

## 2012-05-03 ENCOUNTER — Emergency Department (HOSPITAL_COMMUNITY)
Admission: EM | Admit: 2012-05-03 | Discharge: 2012-05-03 | Disposition: A | Discharge status: Home / Self Care | Payer: Medicaid Other | Attending: Emergency Medicine | Admitting: Emergency Medicine

## 2012-05-03 ENCOUNTER — Encounter (HOSPITAL_COMMUNITY): Payer: Self-pay | Admitting: Emergency Medicine

## 2012-05-03 ENCOUNTER — Emergency Department (HOSPITAL_COMMUNITY): Payer: Medicaid Other

## 2012-05-03 DIAGNOSIS — M549 Dorsalgia, unspecified: Secondary | ICD-10-CM

## 2012-05-03 DIAGNOSIS — R319 Hematuria, unspecified: Secondary | ICD-10-CM | POA: Insufficient documentation

## 2012-05-03 DIAGNOSIS — Y939 Activity, unspecified: Secondary | ICD-10-CM | POA: Insufficient documentation

## 2012-05-03 DIAGNOSIS — Z79899 Other long term (current) drug therapy: Secondary | ICD-10-CM | POA: Insufficient documentation

## 2012-05-03 DIAGNOSIS — W19XXXA Unspecified fall, initial encounter: Secondary | ICD-10-CM

## 2012-05-03 DIAGNOSIS — I252 Old myocardial infarction: Secondary | ICD-10-CM | POA: Insufficient documentation

## 2012-05-03 DIAGNOSIS — Z8673 Personal history of transient ischemic attack (TIA), and cerebral infarction without residual deficits: Secondary | ICD-10-CM | POA: Insufficient documentation

## 2012-05-03 DIAGNOSIS — Z8659 Personal history of other mental and behavioral disorders: Secondary | ICD-10-CM | POA: Insufficient documentation

## 2012-05-03 DIAGNOSIS — Z87891 Personal history of nicotine dependence: Secondary | ICD-10-CM | POA: Insufficient documentation

## 2012-05-03 DIAGNOSIS — IMO0002 Reserved for concepts with insufficient information to code with codable children: Secondary | ICD-10-CM | POA: Insufficient documentation

## 2012-05-03 DIAGNOSIS — Z7982 Long term (current) use of aspirin: Secondary | ICD-10-CM | POA: Insufficient documentation

## 2012-05-03 DIAGNOSIS — W108XXA Fall (on) (from) other stairs and steps, initial encounter: Secondary | ICD-10-CM | POA: Insufficient documentation

## 2012-05-03 DIAGNOSIS — H5440 Blindness, one eye, unspecified eye: Secondary | ICD-10-CM | POA: Insufficient documentation

## 2012-05-03 DIAGNOSIS — Y929 Unspecified place or not applicable: Secondary | ICD-10-CM | POA: Insufficient documentation

## 2012-05-03 DIAGNOSIS — I2510 Atherosclerotic heart disease of native coronary artery without angina pectoris: Secondary | ICD-10-CM | POA: Insufficient documentation

## 2012-05-03 MED ORDER — HYDROCODONE-ACETAMINOPHEN 5-325 MG PO TABS: 1.0000 | ORAL_TABLET | ORAL | Status: AC | PRN

## 2012-05-03 MED ORDER — HYDROCODONE-ACETAMINOPHEN 5-325 MG PO TABS
1.0000 | ORAL_TABLET | Freq: Once | ORAL | Status: AC
  Administered 2012-05-03: 1 via ORAL
  Filled 2012-05-03: qty 1

## 2012-05-03 NOTE — ED Notes (Signed)
Pt presents with back pain, states she fell yesterday down 2-3 back brick steps and landed on her back/side. Pt states pain with moving her back and pain radiating down her left leg. Pt also notes bilateral hip pain but states that it has been ongoing before the incident yesterday.

## 2012-05-03 NOTE — ED Provider Notes (Addendum)
History   This chart was scribed for [name] Cooper III, MD by Charolett Bumpers, ED Scribe. The patient was seen in room APA03/APA03. Patient's care was started at 1904.   CSN: [id number]  Arrival date & time 05/03/12  1747   First MD Initiated Contact with Patient 05/03/12 1904      Chief Complaint  Patient presents with  . Back Pain    The history is provided by the patient. No language interpreter was used.  [name] is a 46 y.o. female who presents to the Emergency Department complaining of constant, severe lower back pain that started after slipping and falling down 2-3 brick steps yesterday and landing on her back. She reports associated hematuria. She also reports falling out of the car, hitting the right side of her back. She describes the pain as shooting and states she has "knots" that are painful. Her back pain is aggravated with movement. She has a h/o chronic back pain. She has a h/o CAD, MI, CVA, and coronary stent placement associated with cocaine use. She reports she is trying to stop smoking. She denies any alcohol or cocaine use.   Past Medical History  Diagnosis Date  . Coronary artery disease   . Stroke   . Depression   . Blindness of right eye   . MI (myocardial infarction)     Past Surgical History  Procedure Date  . Coronary stent placement     Family History  Problem Relation Age of Onset  . Stroke Other   . Seizures Other   . Heart failure Other   . Cancer Other   . Asthma Other     History  Substance Use Topics  . Smoking status: Former Smoker -- 0.5 packs/day for 37 years    Types: Cigarettes  . Smokeless tobacco: Never Used  . Alcohol Use: No    OB History    Grav Para Term Preterm Abortions TAB SAB Ect Mult Living   4 3 3  1  1   3       Review of Systems  Genitourinary: Positive for hematuria. Negative for dysuria.  Musculoskeletal: Positive for back pain.  All other systems reviewed and are negative.    Allergies   Codeine  Home Medications   Current Outpatient Rx  Name  Route  Sig  Dispense  Refill  . ASPIRIN 325 MG PO TBEC   Oral   Take 325 mg by mouth daily.           Marland Kitchen DIAZEPAM 10 MG PO TABS   Oral   Take 10 mg by mouth every 6 (six) hours as needed. For anxiety         . IBUPROFEN 200 MG PO TABS   Oral   Take 200 mg by mouth daily as needed. For pain           BP 131/90  Pulse 73  Temp 98.7 F (37.1 C) (Oral)  Resp 20  Ht 5\' 2"  (1.575 m)  Wt 150 lb (68.04 kg)  BMI 27.44 kg/m2  SpO2 100%  LMP 05/05/2011  Physical Exam  Nursing note and vitals reviewed. Constitutional: She is oriented to person, place, and time. She appears well-developed and well-nourished. No distress.  HENT:  Head: Normocephalic and atraumatic.  Right Ear: External ear normal.  Left Ear: External ear normal.  Nose: Nose normal.  Mouth/Throat: Oropharynx is clear and moist. No oropharyngeal exudate.  Eyes: EOM are normal. Pupils are equal,  round, and reactive to light.  Neck: Normal range of motion. Neck supple. No tracheal deviation present.  Cardiovascular: Normal rate, regular rhythm and normal heart sounds.   No murmur heard. Pulmonary/Chest: Effort normal and breath sounds normal. No respiratory distress. She has no wheezes.  Abdominal: Soft. Bowel sounds are normal. She exhibits no distension. There is no tenderness.  Musculoskeletal: Normal range of motion. She exhibits no edema.       Pain is localized in the upper and right posterior hip but no palpable deformities.   Neurological: She is alert and oriented to person, place, and time.       Speech is slurred.   Skin: Skin is warm and dry.  Psychiatric: She has a normal mood and affect. Her behavior is normal.    ED Course  Procedures (including critical care time)  DIAGNOSTIC STUDIES: Oxygen Saturation is 100% on room air, normal by my interpretation.    COORDINATION OF CARE:  19:30-Discussed planned course of treatment with  the patient including x-rays of right hip and lumbar spine, blood work and UA, who is agreeable at this time.    8:00 PM Pt refused blood tests, did not give a urine sample.  Will send for x-rays.    Dg Lumbar Spine Complete  05/03/2012  *RADIOLOGY REPORT*  Clinical Data: Larey Seat onto bricks stairs yesterday, injuring the low back, and subsequent fall getting out of a truck, injuring the right hip.  LUMBAR SPINE - COMPLETE 4+ VIEW  Comparison: Lumbar spine x-rays 05/12/2011, 04/01/2010.  Findings: Five non-rib bearing lumbar vertebrae with anatomic alignment.  No fractures.  Well-preserved disc spaces.  Mild spondylosis involving the superior endplates of L3 and L4.  No pars defects.  Facet degenerative changes at L4-5 and L5-S1.  Aorto- iliac atherosclerosis without aneurysm.  IMPRESSION:  1.  No acute osseous abnormality. 2.  Facet degenerative changes at L4-5 and L5-S1. 3.  Aorto-iliac atherosclerosis, advanced for age.   Original Report Authenticated By: Hulan Saas, M.D.    Dg Hip Complete Right  05/03/2012  *RADIOLOGY REPORT*  Clinical Data: Larey Seat onto bricks stairs yesterday, injuring the low back, and subsequent fall getting out of a truck, injuring the right hip.  RIGHT HIP - COMPLETE 2+ VIEW  Comparison: None.  Findings: No evidence of acute or subacute fracture or dislocation. Joint space well-preserved.  No intrinsic osseous abnormalities. No visible joint effusion.  Included AP pelvis demonstrates a normal appearing contralateral left hip.  Sacroiliac joints and symphysis pubis intact.  No fractures elsewhere involving the bony pelvis.  IMPRESSION: Normal examination.   Original Report Authenticated By: Hulan Saas, M.D.    X-rays were negative.  Rx hydrocodone-acetaminophen q4h prn pain.  Pt and I had a discussion of her chronic pain situation.  She used to be in pain management with Dr. Carroll Sage, but apparently Dr. Tiburcio Pea has stopped practicing locally.  Will refer to Dr.  Nilsa Nutting, who is a pain management specialist in [place], [place].   1. Fall   2. Back pain        [name] Cooper III, MD 05/03/12 2102     [name] Cooper III, MD 05/03/12 2114

## 2012-05-03 NOTE — ED Notes (Signed)
Pt states she slipped going down step yesterday.

## 2012-05-03 NOTE — ED Notes (Signed)
Pt went to urinate and and forgot to urinate in the specimen cup. Pt given cup of water and will attempt to get sample in 15 min.

## 2012-05-03 NOTE — ED Notes (Signed)
Pt refused to have labs drawn. States "I just had blood drawn across the street the other day" lab explained that we needed another sample here today but pt refused. Dr Ignacia Palma notified and also notified that pt was unable to give [place] a urine specimen

## 2012-07-02 IMAGING — US US ABDOMEN COMPLETE
1 series · 14 of 25 positions shown · non-contrast
Comparison: None

CLINICAL DATA: Abdominal and back pain, question gallbladder
disease

ULTRASOUND ABDOMEN:
TECHNIQUE: Sonography of upper abdominal structures was performed.

[Series 1: us abdomen complete · 0.30mm/px · 14 of 69 slices shown]
[im 1/69]
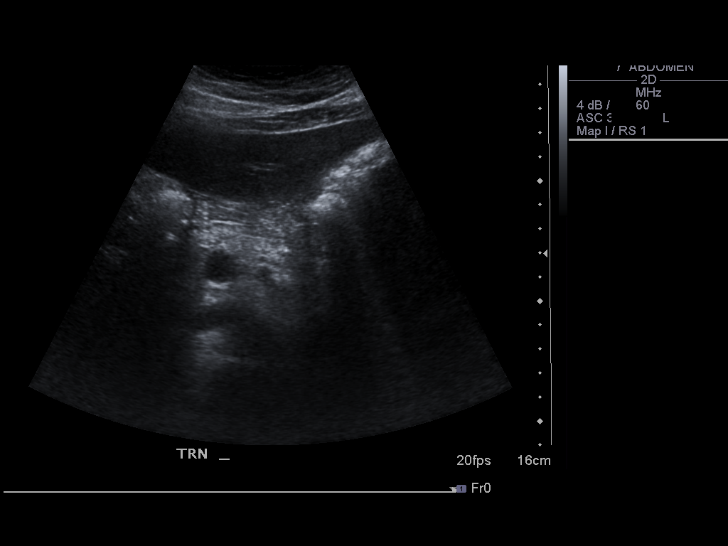
[im 6/69]
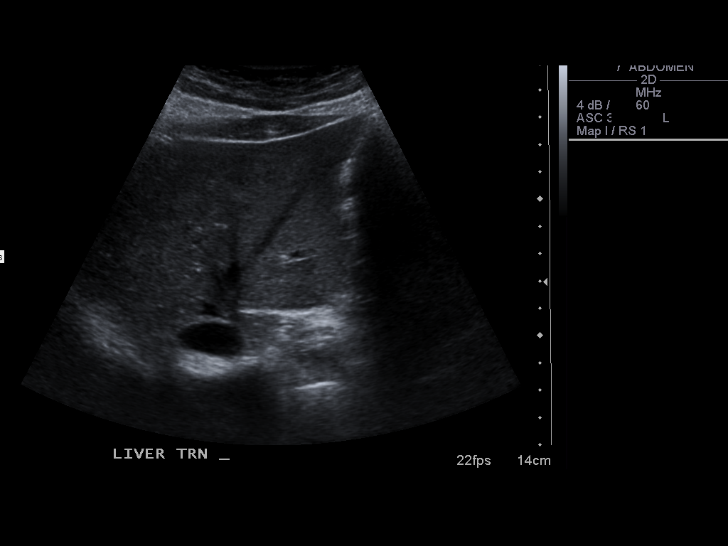
[im 12/69]
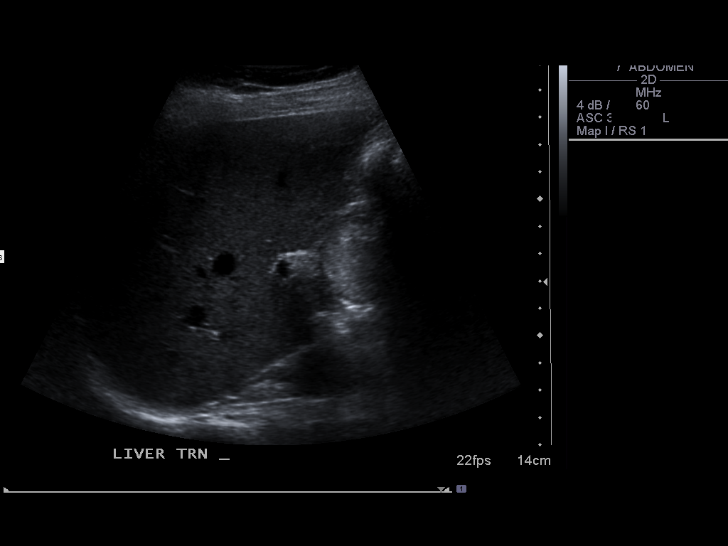
[im 18/69]
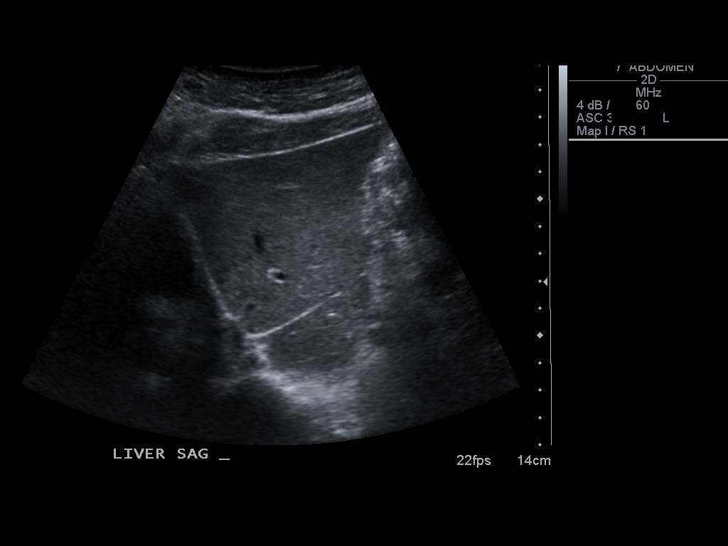
[im 23/69]
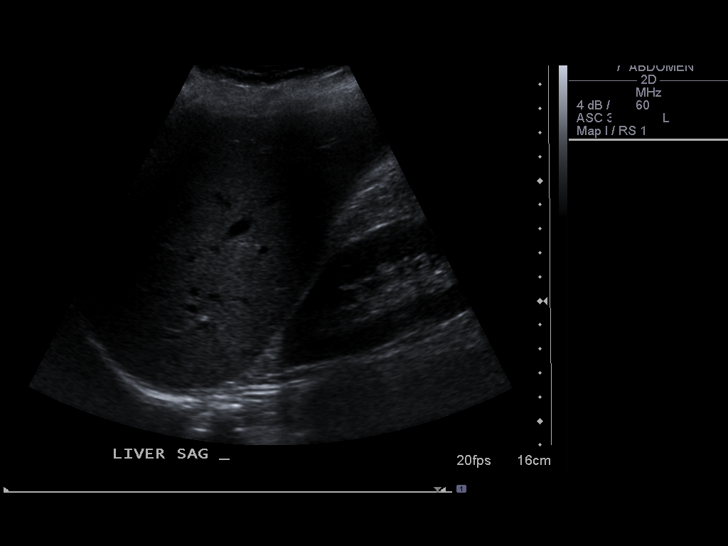
[im 26/69]
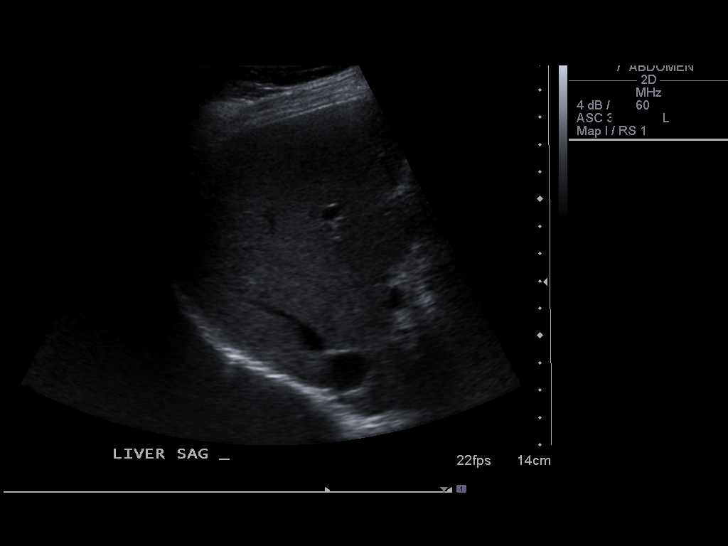
[im 32/69]
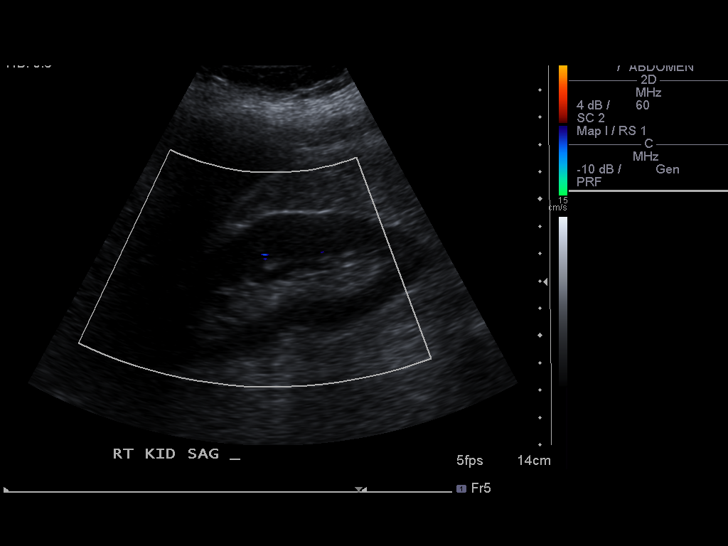
[im 37/69]
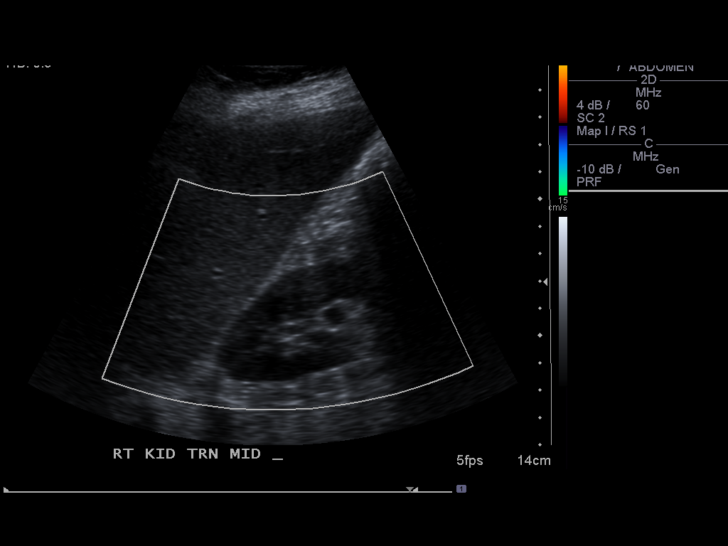
[im 43/69]
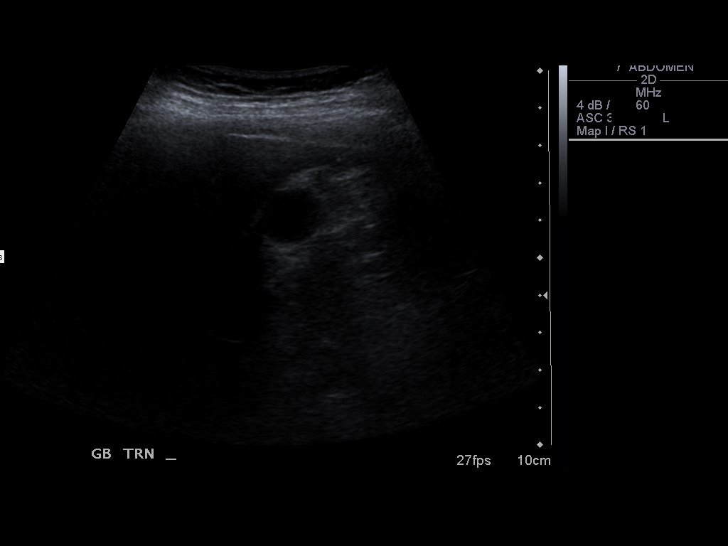
[im 46/69]
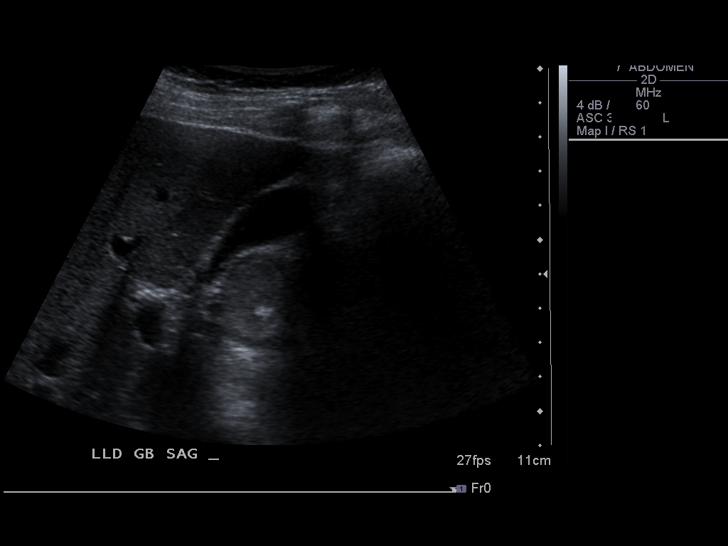
[im 52/69]
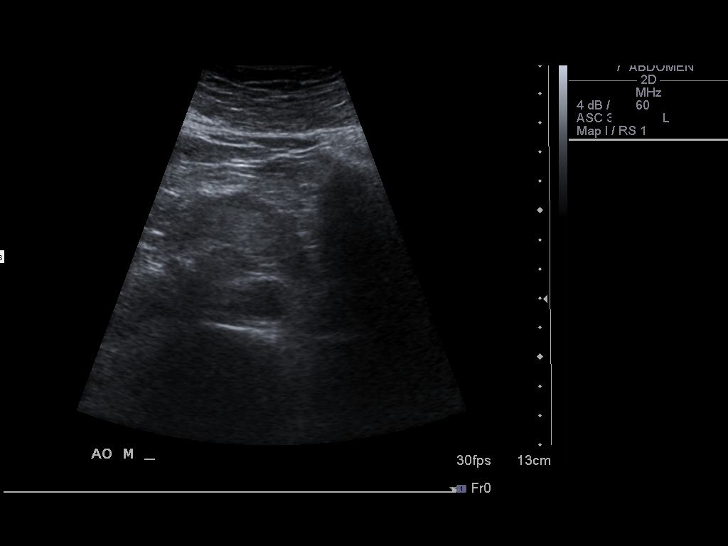
[im 57/69]
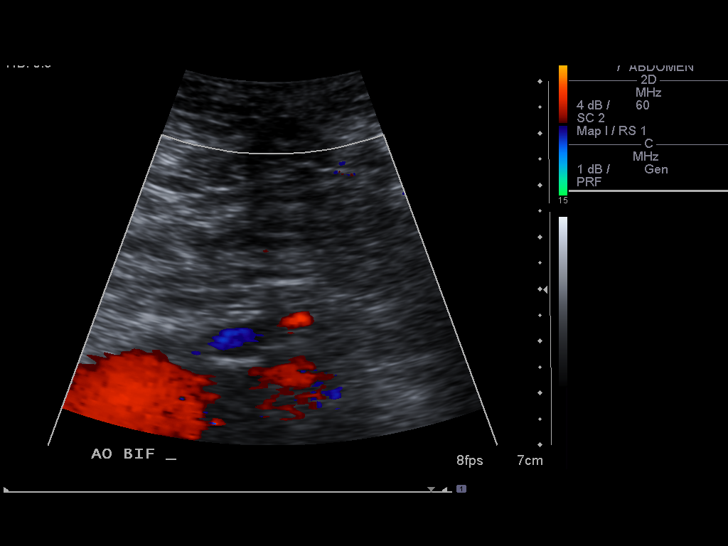
[im 63/69]
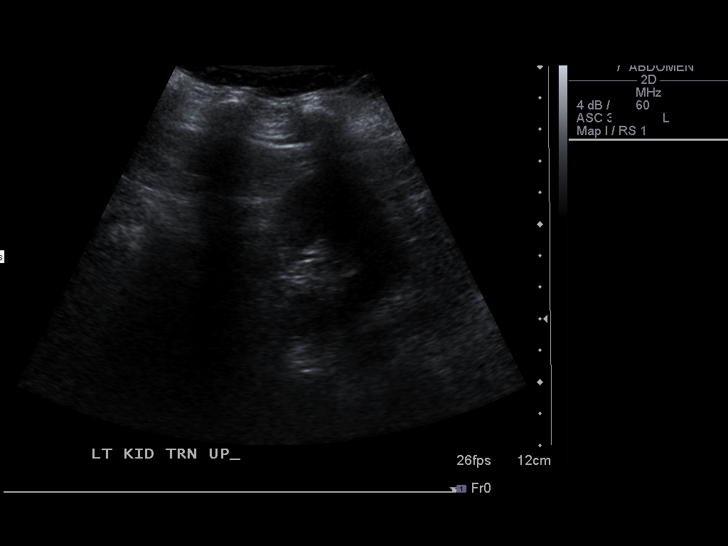
[im 69/69]
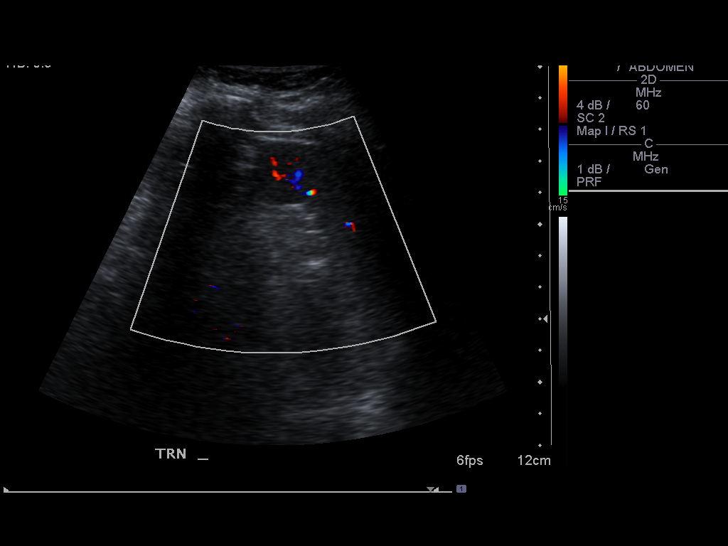

[14 of 25 positions shown; findings below may reference images not displayed]

Gallbladder:  Incompletely distended without stones or definite
wall thickening.
No pericholecystic fluid or sonographic Murphy sign.

Common bile duct:  Normal caliber 4 mm diameter.

Liver:  Normal appearance

IVC:  Unremarkable

Pancreas:  Suboptimally visualized due to bowel gas, inadequately
assessed.

Spleen:  Unremarkable, 6.9 cm length

Right kidney:  10.7 cm length. Normal morphology without mass or
hydronephrosis.

Left kidney:  10.3 cm length. Normal morphology without mass or
hydronephrosis.

Aorta:  Obscured proximally by bowel gas, visualized portions
normal caliber.

Other:  No free fluid
IMPRESSION: Inadequate visualization of the pancreas and proximal aorta due to
bowel gas.
Remainder of exam unremarkable.

## 2012-07-02 IMAGING — CR DG CHEST 2V
2 series · 2 of 2 positions shown · non-contrast
Comparison: 01/31/2009

CLINICAL DATA: Epigastric pain.

CHEST - 2 VIEW

[view not recorded (1 of 2)]
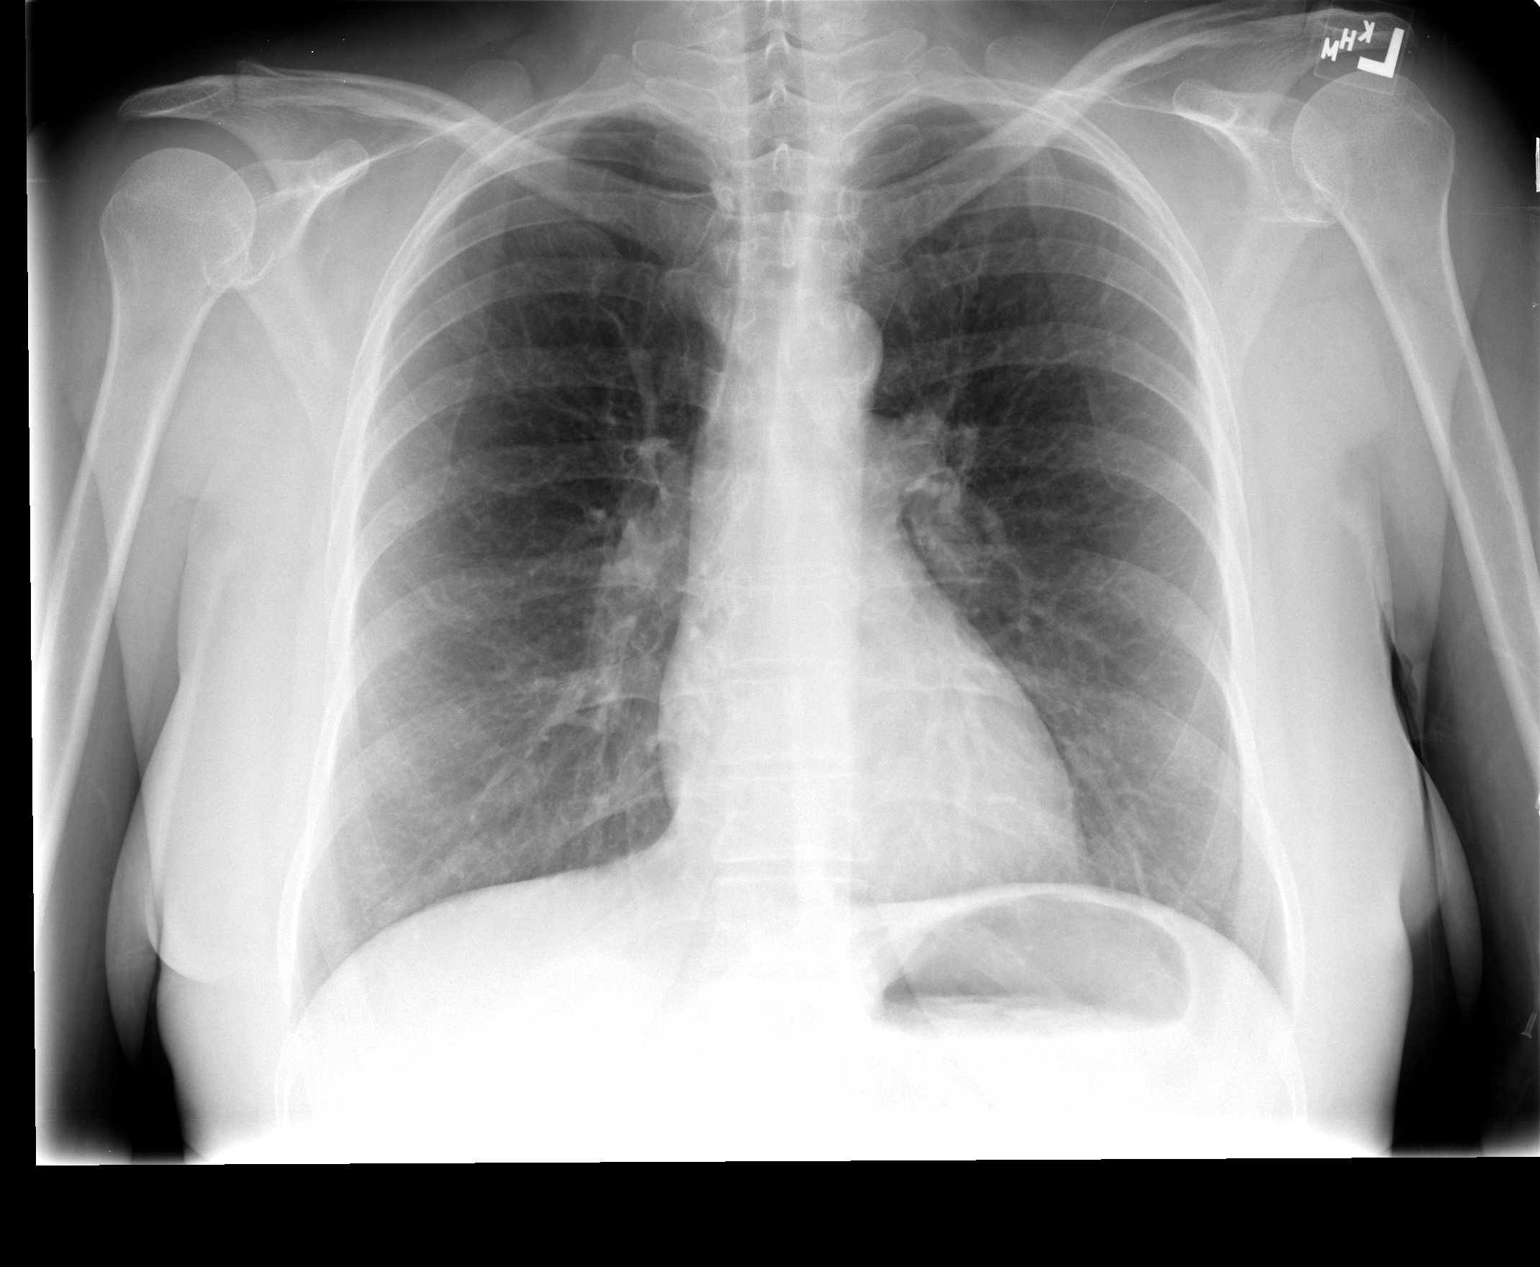

[view not recorded (2 of 2)]
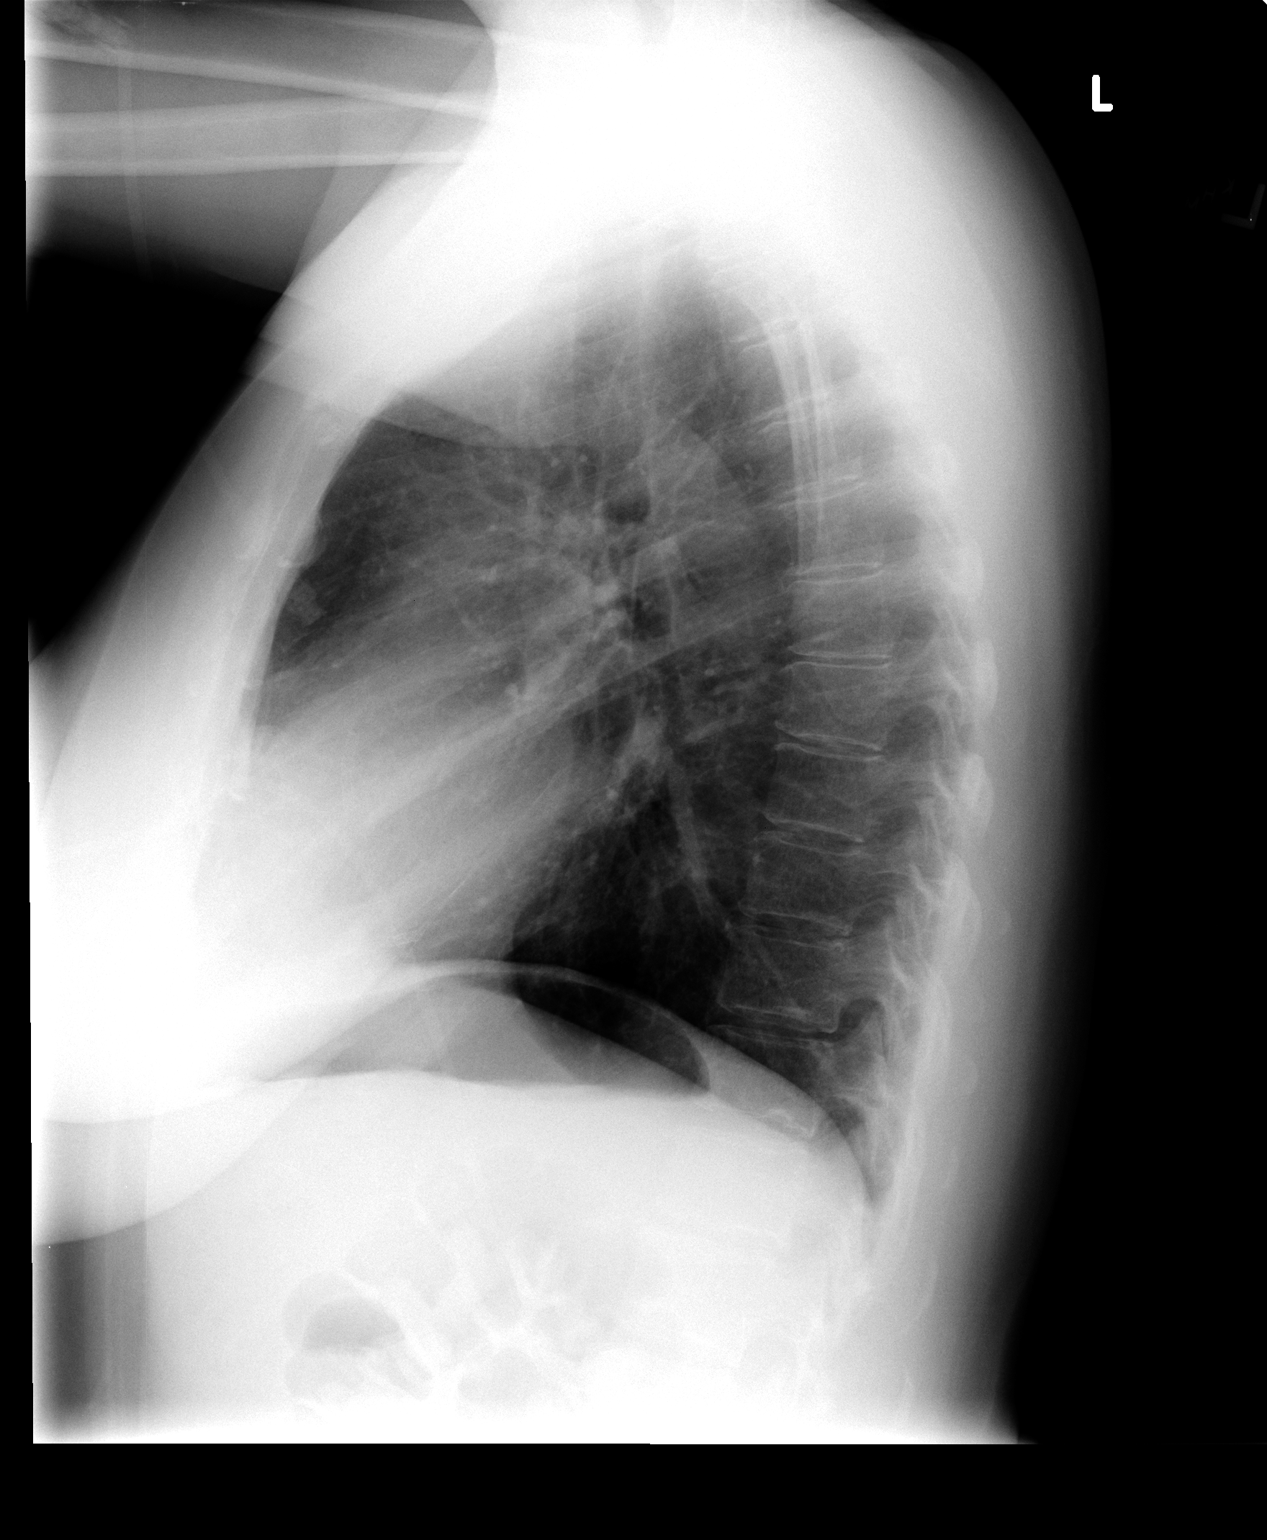

[2 of 2 positions shown; findings below may reference images not displayed]

FINDINGS: The cardiomediastinal silhouette is unremarkable.
The lungs are clear.
There is no evidence of focal airspace disease, pulmonary edema,
pulmonary nodule/mass, pleural effusion, or pneumothorax.
No acute bony abnormalities are identified.
IMPRESSION: No evidence of active cardiopulmonary disease.

## 2012-07-21 IMAGING — CR DG LUMBAR SPINE COMPLETE 4+V
5 series · 5 of 5 positions shown · non-contrast
Comparison: 07/20/2009

CLINICAL DATA: Fell off stairs onto back, pain

LUMBAR SPINE - COMPLETE 4+ VIEW

[view not recorded (1 of 5)]
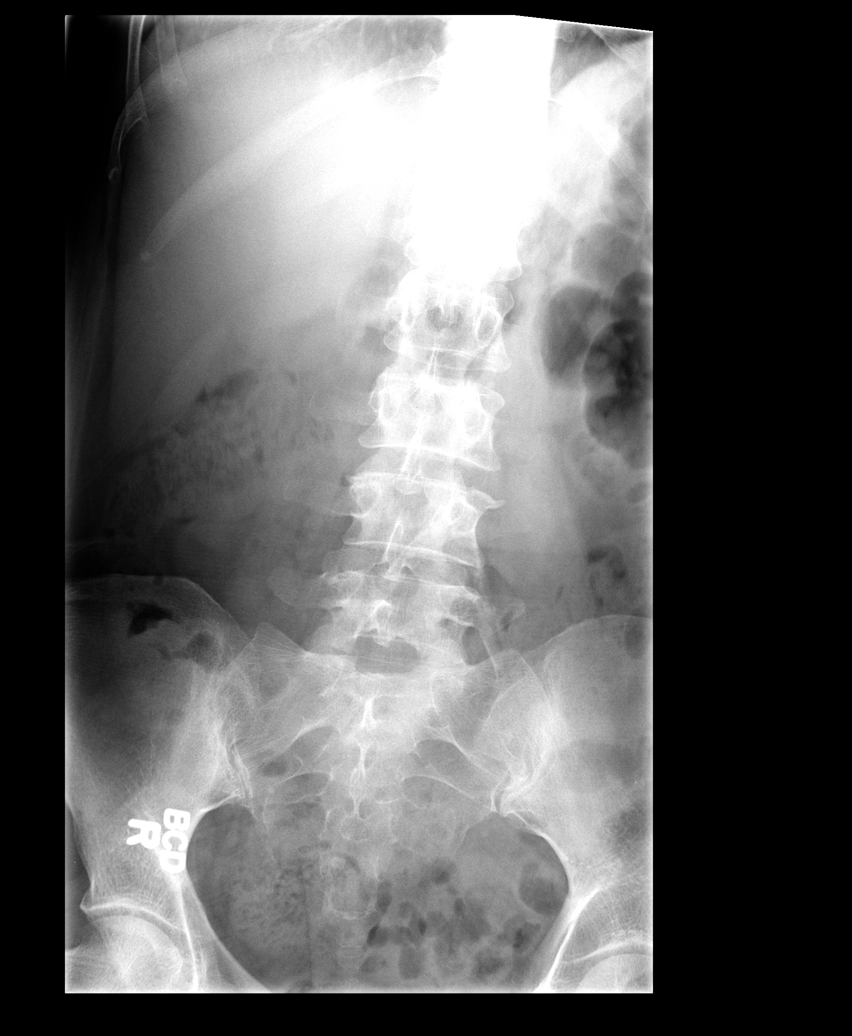

[view not recorded (2 of 5)]
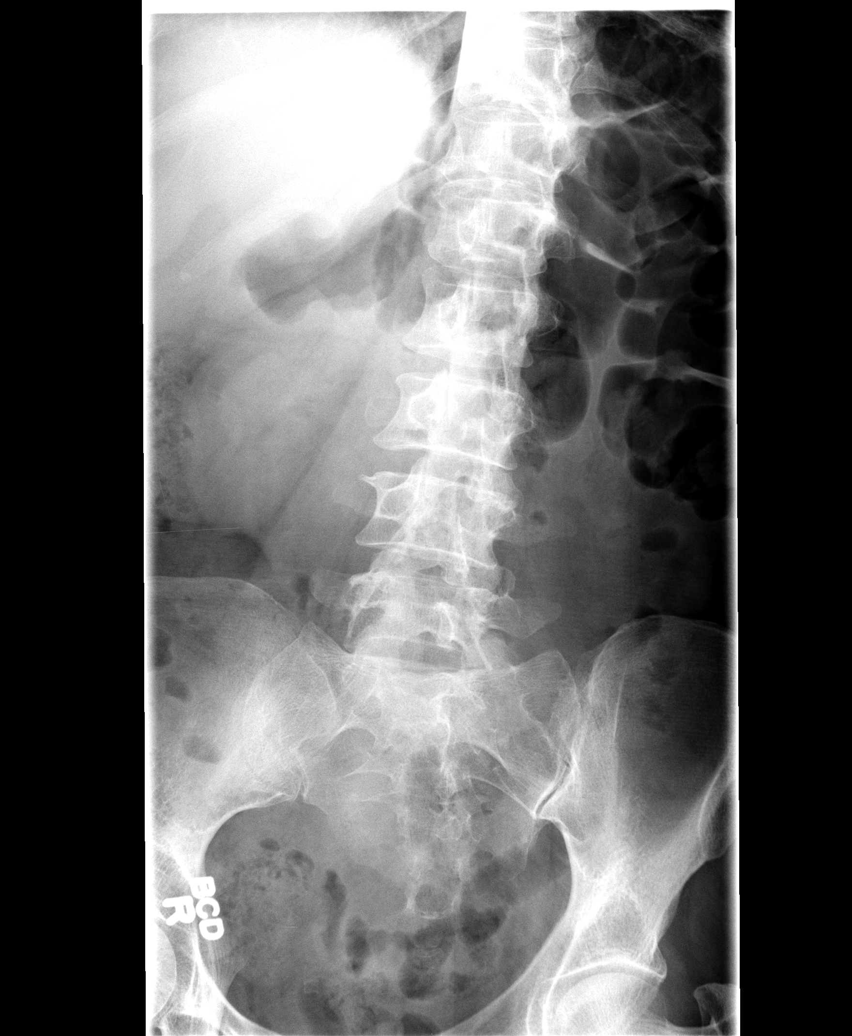

[view not recorded (3 of 5)]
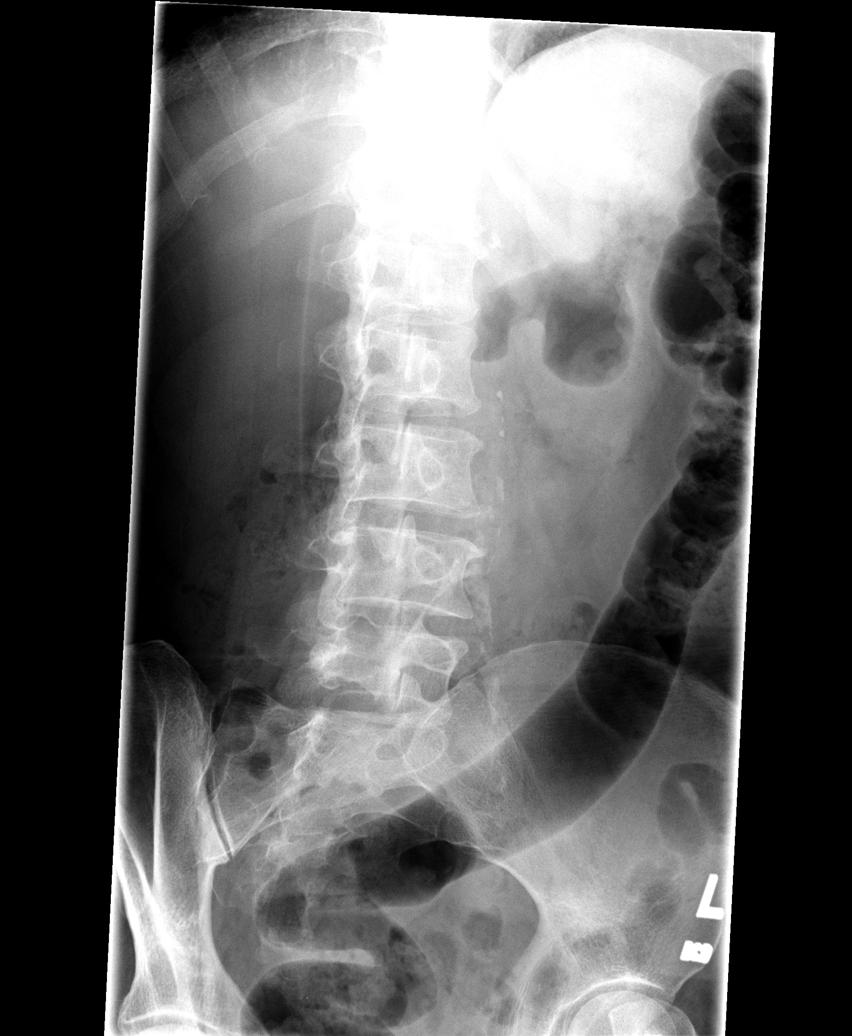

[view not recorded (4 of 5)]
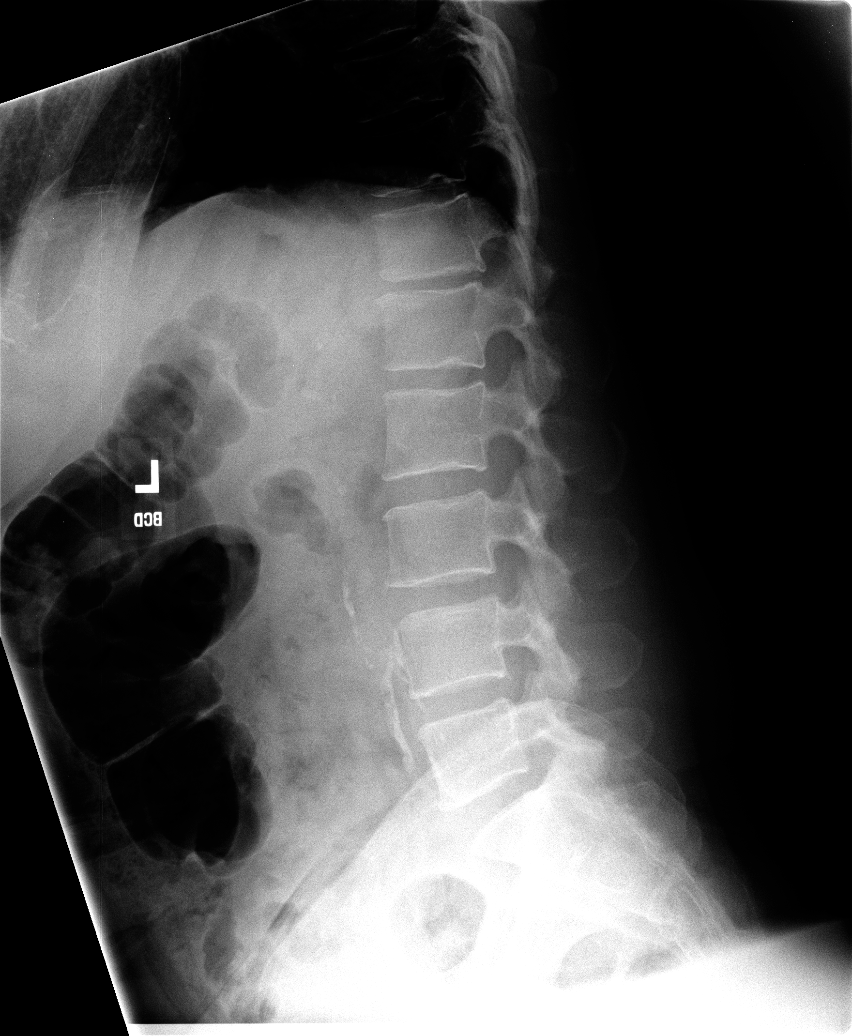

[view not recorded (5 of 5)]
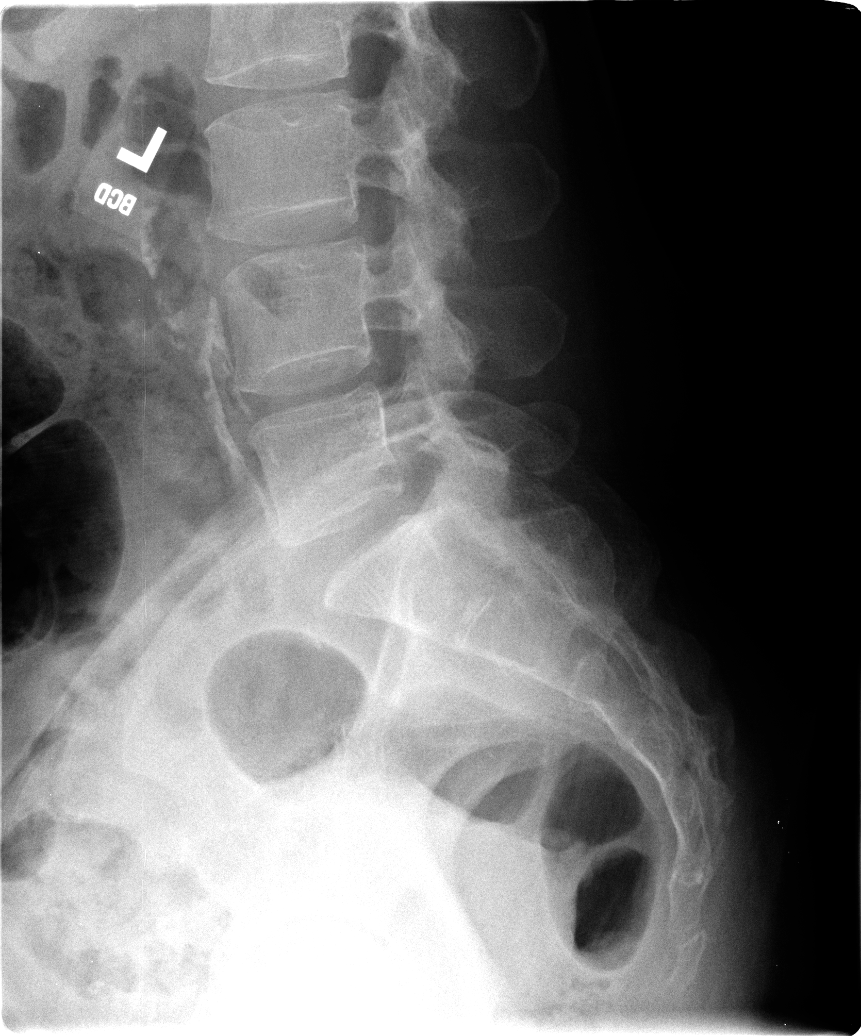

[5 of 5 positions shown; findings below may reference images not displayed]

FINDINGS: Five non-rib bearing lumbar vertebrae.
Bones appear mildly demineralized.
Vertebral body and disc space heights maintained.
Few scattered Schmorl's nodes lower thoracic spine.
No acute fracture, subluxation or bone destruction.
No spondylolysis.
SI joints symmetric.
Atherosclerotic calcifications aorta and iliac arteries.
Small superior endplate spurs L4.
IMPRESSION: No acute osseous abnormalities.

## 2012-08-21 ENCOUNTER — Other Ambulatory Visit: Payer: Self-pay | Admitting: Physician Assistant

## 2012-08-21 ENCOUNTER — Telehealth: Payer: Self-pay | Admitting: Physician Assistant

## 2012-08-21 NOTE — Telephone Encounter (Signed)
Medication refilled per protocol.Patient needs to be seen before any further refills 

## 2012-08-22 NOTE — Telephone Encounter (Signed)
Done 08/712

## 2012-08-29 ENCOUNTER — Encounter: Payer: Self-pay | Admitting: Family Medicine

## 2012-08-29 DIAGNOSIS — G8929 Other chronic pain: Secondary | ICD-10-CM | POA: Insufficient documentation

## 2012-08-29 DIAGNOSIS — I10 Essential (primary) hypertension: Secondary | ICD-10-CM | POA: Insufficient documentation

## 2012-08-30 ENCOUNTER — Encounter: Payer: Self-pay | Admitting: Physician Assistant

## 2012-09-11 ENCOUNTER — Other Ambulatory Visit: Payer: Self-pay | Admitting: Physician Assistant

## 2012-09-12 NOTE — Telephone Encounter (Signed)
Pt need to be seen unable to reach by phone.  Refill denied, hopefully she will call and we can schedule f/u visit.

## 2012-09-13 ENCOUNTER — Other Ambulatory Visit: Payer: Self-pay | Admitting: Physician Assistant

## 2012-09-14 NOTE — Telephone Encounter (Signed)
#  90 given on 4/7.  Pt has CPE appt on 5/19   Tramadol 50 1-2 q6hr prn    Please advise

## 2012-09-15 NOTE — Telephone Encounter (Signed)
Approved. #90+ 0. 

## 2012-09-15 NOTE — Telephone Encounter (Signed)
Medication refilled per protocol. 

## 2012-09-18 ENCOUNTER — Other Ambulatory Visit: Payer: Self-pay | Admitting: Physician Assistant

## 2012-09-18 NOTE — Telephone Encounter (Signed)
Medication refilled per protocol.Patient needs to be seen before any further refills Has appt for CPE 10/02/12

## 2012-09-18 NOTE — Telephone Encounter (Signed)
Medication refilled per protocol. 

## 2012-10-02 ENCOUNTER — Other Ambulatory Visit: Payer: Medicaid Other | Admitting: Physician Assistant

## 2012-10-12 ENCOUNTER — Other Ambulatory Visit: Payer: Self-pay | Admitting: Physician Assistant

## 2012-10-12 NOTE — Telephone Encounter (Signed)
?  ok to refill °

## 2012-10-12 NOTE — Telephone Encounter (Signed)
I cannot tell from this when last Rx was given. Verify that it has been at least 30 days. If so, then may fill for # 90 / 0.

## 2012-10-13 NOTE — Telephone Encounter (Signed)
Last refill 09/13/12  Refill called in.

## 2012-10-19 ENCOUNTER — Ambulatory Visit: Payer: Medicaid Other | Admitting: Family Medicine

## 2012-11-08 ENCOUNTER — Ambulatory Visit (INDEPENDENT_AMBULATORY_CARE_PROVIDER_SITE_OTHER): Payer: Medicaid Other | Admitting: Physician Assistant

## 2012-11-08 ENCOUNTER — Encounter: Payer: Self-pay | Admitting: Physician Assistant

## 2012-11-08 ENCOUNTER — Other Ambulatory Visit: Payer: Self-pay | Admitting: Physician Assistant

## 2012-11-08 VITALS — BP 100/62 | HR 80 | Temp 97.7°F | Resp 18 | Ht 61.5 in | Wt 150.0 lb

## 2012-11-08 DIAGNOSIS — F17200 Nicotine dependence, unspecified, uncomplicated: Secondary | ICD-10-CM

## 2012-11-08 DIAGNOSIS — B1710 Acute hepatitis C without hepatic coma: Secondary | ICD-10-CM

## 2012-11-08 DIAGNOSIS — I2510 Atherosclerotic heart disease of native coronary artery without angina pectoris: Secondary | ICD-10-CM

## 2012-11-08 DIAGNOSIS — E785 Hyperlipidemia, unspecified: Secondary | ICD-10-CM

## 2012-11-08 DIAGNOSIS — Z0000 Encounter for general adult medical examination without abnormal findings: Secondary | ICD-10-CM

## 2012-11-08 DIAGNOSIS — M549 Dorsalgia, unspecified: Secondary | ICD-10-CM

## 2012-11-08 DIAGNOSIS — I10 Essential (primary) hypertension: Secondary | ICD-10-CM

## 2012-11-08 DIAGNOSIS — G8929 Other chronic pain: Secondary | ICD-10-CM

## 2012-11-08 NOTE — Progress Notes (Signed)
Patient ID: [name] MRN: [id number], DOB: 01-03-1966, 47 y.o. Date of Encounter: 11/08/2012,   Chief Complaint: Physical (CPE)  HPI: 47 y.o. y/o female  here for CPE. [facility] Not [facility] date but says was > 3 years ago. Needs pap and CPE.  HTN: She states that she IS taking BP med daily as directed. No adv effect HLD: She says she IS taking Pravastatin as directed. No myalgias, adv effects.   She seems to be on some type of drugs: speech is slurred; her eyes are not open widely. Her speech is very choppy. The nurse who put her in room had told me that pt told her she took a Valium before she came "to help her relax" While I was in room, she started to cry, saying that her back hurts and nobody will give her any medicine that helps, that Tramadol doesn't help. I asked her if she was using anything, and she says she "has to buy whatever she can to help her pain--most recently, Percocet." i asked her if anything else and she says no. Then I asked her about Valium and she says, oh yeah she took one to help her relax but she doesn't usually take that. Says she [facility] alcohol "and all that" [facility]   Smokes cigarettes that taste like cigarettes but not really cigarettes"   Review of Systems: Consitutional: No fever, chills, fatigue, night sweats, lymphadenopathy. No significant/unexplained weight changes. Eyes: No visual changes, eye redness, or discharge. ENT/Mouth: No ear pain, sore throat, nasal drainage, or sinus pain. Cardiovascular: No chest pressure,heaviness, tightness or squeezing, even with exertion. No increased shortness of breath or dyspnea on exertion.No palpitations, edema, orthopnea, PND. Respiratory: No cough, hemoptysis, SOB, or wheezing. Gastrointestinal: No anorexia, dysphagia, reflux, pain, nausea, vomiting, hematemesis, diarrhea, constipation, BRBPR, or melena. Breast: No mass, nodules, bulging, or retraction. No skin changes or  inflammation. No nipple discharge. No lymphadenopathy. Genitourinary: No dysuria, hematuria, incontinence, vaginal discharge, pruritis, burning, abnormal bleeding, or pain. Musculoskeletal: No decreased ROM, No joint pain or swelling. Positive chronic back pain. Skin: No rash, pruritis, or concerning lesions. Neurological: No headache, dizziness, syncope, seizures, tremors, memory loss, coordination problems, or paresthesias. Psychological: No anxiety, depression, hallucinations, SI/HI. Endocrine: No polydipsia, polyphagia, polyuria, or known diabetes.No increased fatigue. No palpitations/rapid heart rate. No significant/unexplained weight change. All other systems were reviewed and are otherwise negative.  Past Medical History  Diagnosis Date  . Coronary artery disease   . Depression   . Blindness of right eye   . Hyperlipidemia   . Hypertension   . Chronic back pain   . Stroke 2002  . MI (myocardial infarction) 2005    stents     Past Surgical History  Procedure Laterality Date  . Coronary stent placement      Home Meds:  Current Outpatient Prescriptions on File Prior to Visit  Medication Sig Dispense Refill  . aspirin 325 MG EC tablet Take 325 mg by mouth daily.        Marland Kitchen ibuprofen (ADVIL,MOTRIN) 200 MG tablet Take 200 mg by mouth daily as needed. For pain      . lisinopril (PRINIVIL,ZESTRIL) 5 MG tablet Take 1 tablet (5 mg total) by mouth daily.  30 tablet  1  . traMADol (ULTRAM) 50 MG tablet TAKE (1) OR (2) TABLETS EVERY SIX HOURS AS NEEDED FOR PAIN.  90 tablet  0  . diazepam (VALIUM) 10 MG tablet Take 10  mg by mouth every 6 (six) hours as needed. For anxiety       No current facility-administered medications on file prior to visit.    Allergies:  Allergies  Allergen Reactions  . Codeine Rash    History   Social History  . Marital Status: Single    Spouse Name: N/A    Number of Children: N/A  . Years of Education: N/A   Occupational History  . Not on file.    Social History Main Topics  . Smoking status: Former Smoker -- 0.50 packs/day for 37 years    Types: Cigarettes  . Smokeless tobacco: Never Used  . Alcohol Use: No  . Drug Use: No  . Sexually Active: Yes    Birth Control/ Protection: Condom   Other Topics Concern  . Not on file   Social History Narrative  . No narrative on file    Family History  Problem Relation Age of Onset  . Stroke Other   . Seizures Other   . Heart failure Other   . Cancer Other   . Asthma Other   . Stroke Mother   . Heart disease Father   . Heart disease Sister   . Heart disease Brother   . Heart disease Brother   . Stroke Brother     Physical Exam: Blood pressure 100/62, pulse 80, temperature 97.7 F (36.5 C), temperature source Oral, resp. rate 18, height 5' 1.5" (1.562 m), weight 150 lb (68.04 kg), last menstrual period 05/03/2010., Body mass index is 27.89 kg/(m^2). General:WF. Hair covering some of her face-bangs re long down into her eyes. Dark circles under eyes. Skin "weathered"  Well developed, well nourished, in no acute distress. HEENT: Normocephalic, atraumatic. Conjunctiva pink, sclera non-icteric. Pupils 2 mm constricting to 1 mm, round, regular, and equally reactive to light and accomodation. EOMI. Internal auditory canal clear. TMs with good cone of light and without pathology. Nasal mucosa pink. Nares are without discharge. No sinus tenderness. Oral mucosa pink.  Pharynx without exudate.   Neck: Supple. Trachea midline. No thyromegaly. Full ROM. No lymphadenopathy.No Carotid Bruits. Lungs: Clear to auscultation bilaterally without wheezes, rales, or rhonchi. Breathing is of normal effort and unlabored. Cardiovascular: RRR with S1 S2. No murmurs, rubs, or gallops. Distal pulses 2+ symmetrically. No carotid or abdominal bruits. Breast: Symmetrical. No masses. Nipples without discharge. Abdomen: Soft, non-tender, non-distended with normoactive bowel sounds. No hepatosplenomegaly or  masses. No rebound/guarding. No CVA tenderness. No hernias.  Genitourinary:  External genitalia without lesions. Vaginal mucosa pink.No discharge present. Cervix pink and without discharge. No cervical tenderness.Normal uterus size. No adnexal mass or tenderness.  Pap smear taken Musculoskeletal: Full range of motion and 5/5 strength throughout. Without swelling, atrophy, tenderness, crepitus, or warmth. Extremities without clubbing, cyanosis, or edema. Calves supple. Skin: Warm and moist without erythema, ecchymosis, wounds, or rash. Neuro: A+Ox3. CN II-XII grossly intact. Moves all extremities spontaneously. Full sensation throughout. Normal gait. DTR 2+ throughout upper and lower extremities.  Psych:  Responds to questions appropriately with a normal affect.   Assessment/Plan:  47 y.o. y/o female here for CPE 1. Visit for preventive health examination  A. Check Urine Drug Screen B. Screening Labs: Not Fasting. RTC Fasting to check.  C. Screening Pap:  - PAP, Thin Prep w/HPV rflx HPV Type 16/18 D. Screening Mammogram: She says she has received letter from imaging facility to sched f/u mammo. She is to call to schedule.  2. ATHEROSCLEROTIC CARDIOVASCULAR DISEASE  3. Hypertension At goal.Cont  current med. RTC fasting to check labs  4. HYPERLIPIDEMIA RTC to check FLP/LFT. She states that she IS taking Pravastatin.  5. Chronic back pain See chart. I will NOT prescribe meds for this.  - Drug Scr Ur, Pain Mgmt, Reflex Conf  6. HEPATITIS C  7. TOBACCO ABUSE  8. H/O Illicit Drug Use 9. H/O PUD 10. Immunizations:  Mcd does not cover Tetanus. Will defer now.    Murray Hodgkins McNary, [place], [facility] 11/08/2012 4:08 PM

## 2012-11-09 ENCOUNTER — Telehealth: Payer: Self-pay | Admitting: Family Medicine

## 2012-11-09 LAB — DRUG SCR UR, PAIN MGMT, REFLEX CONF
Amphetamine Screen, Ur: NEGATIVE
Marijuana Metabolite: NEGATIVE
Methadone: NEGATIVE
Phencyclidine (PCP): NEGATIVE

## 2012-11-09 NOTE — Telephone Encounter (Signed)
?  ok to refill °

## 2012-11-09 NOTE — Telephone Encounter (Signed)
NO. No controlled substances.

## 2012-11-09 NOTE — Telephone Encounter (Signed)
Yes. May give # 90 / 0

## 2012-11-09 NOTE — Telephone Encounter (Signed)
Medication refilled per protocol. 

## 2012-11-09 NOTE — Telephone Encounter (Signed)
ok to refill Tramadol

## 2012-11-10 ENCOUNTER — Telehealth: Payer: Self-pay | Admitting: Family Medicine

## 2012-11-10 LAB — PAP, THIN PREP W/HPV RFLX HPV TYPE 16/18: HPV DNA High Risk: NOT DETECTED

## 2012-11-10 NOTE — Telephone Encounter (Signed)
Left mess. PAP normal. Refills of Tramadol, lisinopril and pravastatin were called to pharmacy.  Pt also informed that NO controlled meds will be refilled from this office

## 2012-11-10 NOTE — Telephone Encounter (Signed)
Message copied by Donne Anon on Fri Nov 10, 2012 11:27 AM ------      Message from: Allayne Butcher      Created: Thu Nov 09, 2012  5:56 PM       Pap Smear normal.       (dont mention UDS to pt. She admitted to me that she had used percocet and told nurse she ahd used valium) ------

## 2012-11-10 NOTE — Telephone Encounter (Signed)
Pt was called and told NO controlled meds from this office

## 2012-11-10 NOTE — Telephone Encounter (Signed)
error 

## 2012-11-11 LAB — BENZODIAZEPINES (GC/LC/MS), URINE
Alprazolam (GC/LC/MS), ur confirm: NEGATIVE ng/mL
Estazolam (GC/LC/MS), ur confirm: NEGATIVE ng/mL
Flunitrazepam metabolite (GC/LC/MS), ur confirm: NEGATIVE ng/mL
Midazolam (GC/LC/MS), ur confirm: NEGATIVE ng/mL
Nordiazepam (GC/LC/MS), ur confirm: NEGATIVE ng/mL
Temazepam (GC/LC/MS), ur confirm: NEGATIVE ng/mL

## 2012-11-11 LAB — OPIATES/OPIOIDS (LC/MS-MS)
Codeine Urine: NEGATIVE ng/mL
Hydrocodone: NEGATIVE ng/mL
Hydromorphone: NEGATIVE ng/mL
Morphine Urine: 3498 ng/mL
Oxymorphone: NEGATIVE ng/mL

## 2012-11-23 ENCOUNTER — Other Ambulatory Visit: Payer: Self-pay | Admitting: Physician Assistant

## 2012-11-24 NOTE — Telephone Encounter (Signed)
RX called in .

## 2012-11-24 NOTE — Telephone Encounter (Signed)
[name], she has been sent dismissal letter. We have to treat her for 30 days after that date. Call in # 30 / 0 refills

## 2012-11-24 NOTE — Telephone Encounter (Signed)
Ok to refill 

## 2012-12-04 ENCOUNTER — Telehealth: Payer: Self-pay | Admitting: Family Medicine

## 2012-12-04 ENCOUNTER — Other Ambulatory Visit: Payer: Self-pay | Admitting: Physician Assistant

## 2012-12-04 MED ORDER — DOXEPIN HCL 25 MG PO CAPS: 25.0000 mg | ORAL_CAPSULE | Freq: Every day | ORAL | Status: DC

## 2012-12-04 MED ORDER — BUPROPION HCL ER (SR) 150 MG PO TB12: 150.0000 mg | ORAL_TABLET | Freq: Two times a day (BID) | ORAL | Status: DC

## 2012-12-04 NOTE — Telephone Encounter (Signed)
Ok to refill 

## 2012-12-04 NOTE — Telephone Encounter (Signed)
This was also documented in another section-approved to give additional 60 for total 90 for the month.

## 2012-12-04 NOTE — Telephone Encounter (Signed)
Med refills done.

## 2012-12-04 NOTE — Telephone Encounter (Signed)
May give additional 60 of Tramadol. No further Valium.

## 2012-12-04 NOTE — Telephone Encounter (Signed)
Rx called into pharmacy  #60 only

## 2012-12-04 NOTE — Telephone Encounter (Signed)
Patient has been dismissed from practice.  Is requesting med refills on all meds.  Pravachol and Lisinopril have 5 refills on them remaining.  One month refills for Wellbutrin and Sinequan sent. Need approval for controlled medication. Let me know how much of Tramadol and Valium you want to give her for now??

## 2013-06-21 ENCOUNTER — Other Ambulatory Visit: Payer: Self-pay | Admitting: Physician Assistant

## 2013-06-22 NOTE — Telephone Encounter (Signed)
Pt has been dismissed refills denied

## 2014-03-18 ENCOUNTER — Encounter: Payer: Self-pay | Admitting: Physician Assistant

## 2014-04-03 ENCOUNTER — Inpatient Hospital Stay (HOSPITAL_COMMUNITY): Payer: Medicaid Other | Admitting: Certified Registered"

## 2014-04-03 ENCOUNTER — Emergency Department (HOSPITAL_COMMUNITY): Payer: Medicaid Other

## 2014-04-03 ENCOUNTER — Inpatient Hospital Stay (HOSPITAL_COMMUNITY)
Admission: EM | Admit: 2014-04-03 | Discharge: 2014-04-08 | DRG: 581 | Disposition: A | Discharge status: Home / Self Care | Payer: Medicaid Other | Attending: Internal Medicine | Admitting: Internal Medicine

## 2014-04-03 ENCOUNTER — Encounter (HOSPITAL_COMMUNITY)
Admission: EM | Disposition: A | Discharge status: Home / Self Care | Payer: Self-pay | Source: Home / Self Care | Attending: Internal Medicine

## 2014-04-03 ENCOUNTER — Encounter (HOSPITAL_COMMUNITY): Payer: Self-pay | Admitting: *Deleted

## 2014-04-03 DIAGNOSIS — Z823 Family history of stroke: Secondary | ICD-10-CM | POA: Diagnosis not present

## 2014-04-03 DIAGNOSIS — G8929 Other chronic pain: Secondary | ICD-10-CM | POA: Diagnosis present

## 2014-04-03 DIAGNOSIS — L03011 Cellulitis of right finger: Secondary | ICD-10-CM | POA: Diagnosis present

## 2014-04-03 DIAGNOSIS — S62102A Fracture of unspecified carpal bone, left wrist, initial encounter for closed fracture: Secondary | ICD-10-CM | POA: Diagnosis not present

## 2014-04-03 DIAGNOSIS — Z7982 Long term (current) use of aspirin: Secondary | ICD-10-CM | POA: Diagnosis not present

## 2014-04-03 DIAGNOSIS — H5441 Blindness, right eye, normal vision left eye: Secondary | ICD-10-CM | POA: Diagnosis present

## 2014-04-03 DIAGNOSIS — L02511 Cutaneous abscess of right hand: Principal | ICD-10-CM | POA: Diagnosis present

## 2014-04-03 DIAGNOSIS — E785 Hyperlipidemia, unspecified: Secondary | ICD-10-CM

## 2014-04-03 DIAGNOSIS — Z8249 Family history of ischemic heart disease and other diseases of the circulatory system: Secondary | ICD-10-CM

## 2014-04-03 DIAGNOSIS — F418 Other specified anxiety disorders: Secondary | ICD-10-CM | POA: Diagnosis not present

## 2014-04-03 DIAGNOSIS — L03113 Cellulitis of right upper limb: Secondary | ICD-10-CM

## 2014-04-03 DIAGNOSIS — S60420A Blister (nonthermal) of right index finger, initial encounter: Secondary | ICD-10-CM

## 2014-04-03 DIAGNOSIS — I10 Essential (primary) hypertension: Secondary | ICD-10-CM | POA: Diagnosis present

## 2014-04-03 DIAGNOSIS — L0390 Cellulitis, unspecified: Secondary | ICD-10-CM

## 2014-04-03 DIAGNOSIS — Z888 Allergy status to other drugs, medicaments and biological substances status: Secondary | ICD-10-CM | POA: Diagnosis not present

## 2014-04-03 DIAGNOSIS — Z955 Presence of coronary angioplasty implant and graft: Secondary | ICD-10-CM

## 2014-04-03 DIAGNOSIS — Y92410 Unspecified street and highway as the place of occurrence of the external cause: Secondary | ICD-10-CM

## 2014-04-03 DIAGNOSIS — M79641 Pain in right hand: Secondary | ICD-10-CM | POA: Diagnosis present

## 2014-04-03 DIAGNOSIS — I2510 Atherosclerotic heart disease of native coronary artery without angina pectoris: Secondary | ICD-10-CM | POA: Diagnosis not present

## 2014-04-03 DIAGNOSIS — B9562 Methicillin resistant Staphylococcus aureus infection as the cause of diseases classified elsewhere: Secondary | ICD-10-CM | POA: Diagnosis present

## 2014-04-03 DIAGNOSIS — L0291 Cutaneous abscess, unspecified: Secondary | ICD-10-CM | POA: Diagnosis present

## 2014-04-03 DIAGNOSIS — I959 Hypotension, unspecified: Secondary | ICD-10-CM | POA: Diagnosis present

## 2014-04-03 DIAGNOSIS — I252 Old myocardial infarction: Secondary | ICD-10-CM | POA: Diagnosis not present

## 2014-04-03 DIAGNOSIS — M25539 Pain in unspecified wrist: Secondary | ICD-10-CM

## 2014-04-03 DIAGNOSIS — Z8673 Personal history of transient ischemic attack (TIA), and cerebral infarction without residual deficits: Secondary | ICD-10-CM

## 2014-04-03 DIAGNOSIS — S62501A Fracture of unspecified phalanx of right thumb, initial encounter for closed fracture: Secondary | ICD-10-CM | POA: Diagnosis not present

## 2014-04-03 DIAGNOSIS — S62509A Fracture of unspecified phalanx of unspecified thumb, initial encounter for closed fracture: Secondary | ICD-10-CM | POA: Diagnosis present

## 2014-04-03 HISTORY — PX: I & D EXTREMITY: SHX5045

## 2014-04-03 LAB — CBC WITH DIFFERENTIAL/PLATELET
Basophils Absolute: 0 10*3/uL (ref 0.0–0.1)
Basophils Relative: 0 % (ref 0–1)
EOS PCT: 0 % (ref 0–5)
Eosinophils Absolute: 0 10*3/uL (ref 0.0–0.7)
HEMATOCRIT: 43.1 % (ref 36.0–46.0)
Hemoglobin: 14.6 g/dL (ref 12.0–15.0)
LYMPHS ABS: 2.2 10*3/uL (ref 0.7–4.0)
Lymphocytes Relative: 18 % (ref 12–46)
MCH: 31.1 pg (ref 26.0–34.0)
MCHC: 33.9 g/dL (ref 30.0–36.0)
MCV: 91.7 fL (ref 78.0–100.0)
Monocytes Absolute: 0.8 10*3/uL (ref 0.1–1.0)
Monocytes Relative: 7 % (ref 3–12)
NEUTROS ABS: 9.3 10*3/uL — AB (ref 1.7–7.7)
Neutrophils Relative %: 75 % (ref 43–77)
Platelets: 296 10*3/uL (ref 150–400)
RBC: 4.7 MIL/uL (ref 3.87–5.11)
RDW: 12.6 % (ref 11.5–15.5)
WBC: 12.4 10*3/uL — AB (ref 4.0–10.5)

## 2014-04-03 LAB — BASIC METABOLIC PANEL
Anion gap: 11 (ref 5–15)
BUN: 8 mg/dL (ref 6–23)
CALCIUM: 9.4 mg/dL (ref 8.4–10.5)
CHLORIDE: 99 meq/L (ref 96–112)
CO2: 27 meq/L (ref 19–32)
Creatinine, Ser: 0.62 mg/dL (ref 0.50–1.10)
GFR calc Af Amer: >90 mL/min (ref >90)
GFR calc non Af Amer: >90 mL/min (ref >90)
GLUCOSE: 109 mg/dL — AB (ref 70–99)
Potassium: 3.9 mEq/L (ref 3.7–5.3)
SODIUM: 137 meq/L (ref 137–147)

## 2014-04-03 SURGERY — IRRIGATION AND DEBRIDEMENT EXTREMITY
Anesthesia: General | Site: Finger | Laterality: Right

## 2014-04-03 MED ORDER — ONDANSETRON HCL 4 MG PO TABS: 4.0000 mg | ORAL_TABLET | Freq: Four times a day (QID) | ORAL | Status: DC | PRN

## 2014-04-03 MED ORDER — LIDOCAINE HCL (CARDIAC) 20 MG/ML IV SOLN
INTRAVENOUS | Status: AC
  Filled 2014-04-03: qty 5

## 2014-04-03 MED ORDER — PROPOFOL 10 MG/ML IV BOLUS
INTRAVENOUS | Status: DC | PRN
  Administered 2014-04-03: 160 mg via INTRAVENOUS

## 2014-04-03 MED ORDER — VANCOMYCIN HCL IN DEXTROSE 1-5 GM/200ML-% IV SOLN
1000.0000 mg | Freq: Once | INTRAVENOUS | Status: AC
  Administered 2014-04-03: 1000 mg via INTRAVENOUS
  Filled 2014-04-03: qty 200

## 2014-04-03 MED ORDER — SODIUM CHLORIDE 0.9 % IV SOLN
INTRAVENOUS | Status: DC
  Administered 2014-04-04 – 2014-04-07 (×6): via INTRAVENOUS

## 2014-04-03 MED ORDER — ALPRAZOLAM 0.5 MG PO TABS
0.5000 mg | ORAL_TABLET | Freq: Two times a day (BID) | ORAL | Status: DC | PRN
  Administered 2014-04-04 – 2014-04-06 (×7): 0.5 mg via ORAL
  Filled 2014-04-03 (×7): qty 1

## 2014-04-03 MED ORDER — MORPHINE SULFATE 4 MG/ML IJ SOLN: 4.0000 mg | Freq: Once | INTRAMUSCULAR | Status: AC

## 2014-04-03 MED ORDER — LIDOCAINE HCL (CARDIAC) 20 MG/ML IV SOLN
INTRAVENOUS | Status: DC | PRN
  Administered 2014-04-03: 60 mg via INTRAVENOUS

## 2014-04-03 MED ORDER — FENTANYL CITRATE 0.05 MG/ML IJ SOLN
INTRAMUSCULAR | Status: DC | PRN
  Administered 2014-04-03 – 2014-04-04 (×2): 100 ug via INTRAVENOUS

## 2014-04-03 MED ORDER — HEPARIN SODIUM (PORCINE) 5000 UNIT/ML IJ SOLN
5000.0000 [IU] | Freq: Three times a day (TID) | INTRAMUSCULAR | Status: DC
  Administered 2014-04-03 – 2014-04-08 (×15): 5000 [IU] via SUBCUTANEOUS
  Filled 2014-04-03 (×23): qty 1

## 2014-04-03 MED ORDER — ASPIRIN EC 325 MG PO TBEC
325.0000 mg | DELAYED_RELEASE_TABLET | Freq: Every day | ORAL | Status: DC
  Administered 2014-04-04 – 2014-04-08 (×5): 325 mg via ORAL
  Filled 2014-04-03 (×6): qty 1

## 2014-04-03 MED ORDER — FENTANYL CITRATE 0.05 MG/ML IJ SOLN
50.0000 ug | Freq: Once | INTRAMUSCULAR | Status: AC
  Administered 2014-04-03: 50 ug via INTRAVENOUS
  Filled 2014-04-03: qty 2

## 2014-04-03 MED ORDER — MORPHINE SULFATE 4 MG/ML IJ SOLN
4.0000 mg | INTRAMUSCULAR | Status: DC | PRN
  Administered 2014-04-03 – 2014-04-04 (×4): 4 mg via INTRAVENOUS
  Filled 2014-04-03 (×5): qty 1

## 2014-04-03 MED ORDER — SODIUM CHLORIDE 0.9 % IR SOLN
Status: DC | PRN
  Administered 2014-04-03: 3000 mL

## 2014-04-03 MED ORDER — ACETAMINOPHEN 325 MG PO TABS: 650.0000 mg | ORAL_TABLET | Freq: Four times a day (QID) | ORAL | Status: DC | PRN

## 2014-04-03 MED ORDER — PRAVASTATIN SODIUM 40 MG PO TABS
40.0000 mg | ORAL_TABLET | Freq: Every day | ORAL | Status: DC
  Administered 2014-04-05 – 2014-04-07 (×3): 40 mg via ORAL
  Filled 2014-04-03 (×7): qty 1

## 2014-04-03 MED ORDER — ONDANSETRON HCL 4 MG/2ML IJ SOLN
INTRAMUSCULAR | Status: DC | PRN
  Administered 2014-04-03: 4 mg via INTRAVENOUS

## 2014-04-03 MED ORDER — FENTANYL CITRATE 0.05 MG/ML IJ SOLN
INTRAMUSCULAR | Status: AC
  Filled 2014-04-03: qty 5

## 2014-04-03 MED ORDER — MIDAZOLAM HCL 2 MG/2ML IJ SOLN
INTRAMUSCULAR | Status: AC
  Filled 2014-04-03: qty 2

## 2014-04-03 MED ORDER — SODIUM CHLORIDE 0.9 % IV BOLUS (SEPSIS)
1000.0000 mL | Freq: Once | INTRAVENOUS | Status: AC
  Administered 2014-04-03: 1000 mL via INTRAVENOUS

## 2014-04-03 MED ORDER — SUCCINYLCHOLINE CHLORIDE 20 MG/ML IJ SOLN
INTRAMUSCULAR | Status: DC | PRN
  Administered 2014-04-03: 100 mg via INTRAVENOUS

## 2014-04-03 MED ORDER — MIDAZOLAM HCL 2 MG/2ML IJ SOLN
INTRAMUSCULAR | Status: DC | PRN
  Administered 2014-04-03: 2 mg via INTRAVENOUS

## 2014-04-03 MED ORDER — PROPOFOL 10 MG/ML IV BOLUS
INTRAVENOUS | Status: AC
  Filled 2014-04-03: qty 20

## 2014-04-03 MED ORDER — ACETAMINOPHEN 650 MG RE SUPP: 650.0000 mg | Freq: Four times a day (QID) | RECTAL | Status: DC | PRN

## 2014-04-03 MED ORDER — ONDANSETRON HCL 4 MG/2ML IJ SOLN: 4.0000 mg | Freq: Four times a day (QID) | INTRAMUSCULAR | Status: DC | PRN

## 2014-04-03 MED ORDER — POLYETHYLENE GLYCOL 3350 17 G PO PACK: 17.0000 g | PACK | Freq: Every day | ORAL | Status: DC | PRN

## 2014-04-03 MED ORDER — ONDANSETRON HCL 4 MG/2ML IJ SOLN
INTRAMUSCULAR | Status: AC
  Filled 2014-04-03: qty 2

## 2014-04-03 MED ORDER — PHENYLEPHRINE HCL 10 MG/ML IJ SOLN
INTRAMUSCULAR | Status: DC | PRN
  Administered 2014-04-03 (×2): 80 ug via INTRAVENOUS

## 2014-04-03 MED ORDER — HYDROMORPHONE HCL 1 MG/ML IJ SOLN
1.0000 mg | Freq: Once | INTRAMUSCULAR | Status: AC
  Administered 2014-04-03: 1 mg via INTRAVENOUS
  Filled 2014-04-03: qty 1

## 2014-04-03 MED ORDER — VANCOMYCIN HCL IN DEXTROSE 750-5 MG/150ML-% IV SOLN
750.0000 mg | Freq: Two times a day (BID) | INTRAVENOUS | Status: DC
  Administered 2014-04-04 – 2014-04-08 (×9): 750 mg via INTRAVENOUS
  Filled 2014-04-03 (×13): qty 150

## 2014-04-03 MED ORDER — SENNA 8.6 MG PO TABS
1.0000 | ORAL_TABLET | Freq: Two times a day (BID) | ORAL | Status: DC
  Administered 2014-04-04 – 2014-04-08 (×9): 8.6 mg via ORAL
  Filled 2014-04-03 (×16): qty 1

## 2014-04-03 SURGICAL SUPPLY — 57 items
BANDAGE ELASTIC 3 VELCRO ST LF (GAUZE/BANDAGES/DRESSINGS) ×3 IMPLANT
BANDAGE ELASTIC 4 VELCRO ST LF (GAUZE/BANDAGES/DRESSINGS) ×3 IMPLANT
BNDG CMPR 9X4 STRL LF SNTH (GAUZE/BANDAGES/DRESSINGS) ×1
BNDG COHESIVE 1X5 TAN STRL LF (GAUZE/BANDAGES/DRESSINGS) IMPLANT
BNDG CONFORM 2 STRL LF (GAUZE/BANDAGES/DRESSINGS) IMPLANT
BNDG ESMARK 4X9 LF (GAUZE/BANDAGES/DRESSINGS) ×3 IMPLANT
BNDG GAUZE ELAST 4 BULKY (GAUZE/BANDAGES/DRESSINGS) ×3 IMPLANT
CORDS BIPOLAR (ELECTRODE) ×5 IMPLANT
COVER SURGICAL LIGHT HANDLE (MISCELLANEOUS) ×3 IMPLANT
CUFF TOURNIQUET SINGLE 18IN (TOURNIQUET CUFF) ×3 IMPLANT
CUFF TOURNIQUET SINGLE 24IN (TOURNIQUET CUFF) IMPLANT
DRAIN PENROSE 1/4X12 LTX STRL (WOUND CARE) IMPLANT
DRAPE SURG 17X23 STRL (DRAPES) ×3 IMPLANT
DRSG ADAPTIC 3X8 NADH LF (GAUZE/BANDAGES/DRESSINGS) ×3 IMPLANT
ELECT REM PT RETURN 9FT ADLT (ELECTROSURGICAL)
ELECTRODE REM PT RTRN 9FT ADLT (ELECTROSURGICAL) IMPLANT
GAUZE SPONGE 4X4 12PLY STRL (GAUZE/BANDAGES/DRESSINGS) ×3 IMPLANT
GAUZE XEROFORM 1X8 LF (GAUZE/BANDAGES/DRESSINGS) ×3 IMPLANT
GAUZE XEROFORM 5X9 LF (GAUZE/BANDAGES/DRESSINGS) IMPLANT
GLOVE BIOGEL PI IND STRL 8.5 (GLOVE) ×1 IMPLANT
GLOVE BIOGEL PI INDICATOR 8.5 (GLOVE) ×2
GLOVE SURG ORTHO 8.0 STRL STRW (GLOVE) ×3 IMPLANT
GLOVE SURG SS PI 8.0 STRL IVOR (GLOVE) ×2 IMPLANT
GOWN STRL REUS W/ TWL LRG LVL3 (GOWN DISPOSABLE) ×3 IMPLANT
GOWN STRL REUS W/ TWL XL LVL3 (GOWN DISPOSABLE) ×1 IMPLANT
GOWN STRL REUS W/TWL LRG LVL3 (GOWN DISPOSABLE) ×9
GOWN STRL REUS W/TWL XL LVL3 (GOWN DISPOSABLE) ×3
HANDPIECE INTERPULSE COAX TIP (DISPOSABLE)
KIT BASIN OR (CUSTOM PROCEDURE TRAY) ×3 IMPLANT
KIT ROOM TURNOVER OR (KITS) ×3 IMPLANT
LOOP VESSEL MAXI BLUE (MISCELLANEOUS) ×2 IMPLANT
MANIFOLD NEPTUNE II (INSTRUMENTS) ×3 IMPLANT
NDL HYPO 25GX1X1/2 BEV (NEEDLE) IMPLANT
NEEDLE HYPO 25GX1X1/2 BEV (NEEDLE) IMPLANT
NS IRRIG 1000ML POUR BTL (IV SOLUTION) ×3 IMPLANT
PACK ORTHO EXTREMITY (CUSTOM PROCEDURE TRAY) ×3 IMPLANT
PAD ARMBOARD 7.5X6 YLW CONV (MISCELLANEOUS) ×6 IMPLANT
PAD CAST 4YDX4 CTTN HI CHSV (CAST SUPPLIES) ×1 IMPLANT
PADDING CAST COTTON 4X4 STRL (CAST SUPPLIES) ×3
SET HNDPC FAN SPRY TIP SCT (DISPOSABLE) IMPLANT
SOAP 2 % CHG 4 OZ (WOUND CARE) ×3 IMPLANT
SPONGE LAP 18X18 X RAY DECT (DISPOSABLE) ×3 IMPLANT
SPONGE LAP 4X18 X RAY DECT (DISPOSABLE) ×3 IMPLANT
SUCTION FRAZIER TIP 10 FR DISP (SUCTIONS) ×3 IMPLANT
SUT ETHILON 4 0 PS 2 18 (SUTURE) IMPLANT
SUT ETHILON 5 0 P 3 18 (SUTURE) ×2
SUT NYLON ETHILON 5-0 P-3 1X18 (SUTURE) ×1 IMPLANT
SUT PROLENE 4 0 PS 2 18 (SUTURE) ×4 IMPLANT
SYR CONTROL 10ML LL (SYRINGE) IMPLANT
TOWEL OR 17X24 6PK STRL BLUE (TOWEL DISPOSABLE) ×3 IMPLANT
TOWEL OR 17X26 10 PK STRL BLUE (TOWEL DISPOSABLE) ×3 IMPLANT
TUBE ANAEROBIC SPECIMEN COL (MISCELLANEOUS) IMPLANT
TUBE CONNECTING 12'X1/4 (SUCTIONS) ×1
TUBE CONNECTING 12X1/4 (SUCTIONS) ×2 IMPLANT
UNDERPAD 30X30 INCONTINENT (UNDERPADS AND DIAPERS) ×3 IMPLANT
WATER STERILE IRR 1000ML POUR (IV SOLUTION) ×3 IMPLANT
YANKAUER SUCT BULB TIP NO VENT (SUCTIONS) ×3 IMPLANT

## 2014-04-03 NOTE — ED Provider Notes (Signed)
This chart was scribed for Layla MawKristen N Ward, DO by Annye AsaAnna Dorsett, ED Scribe. This patient was seen in room [facility] and the patient's care was started at 2:34 PM.   TIME SEEN: 2:34 PM  CHIEF COMPLAINT: hand pain, redness, swelling  HPI: Patient is a 48 y.o. F with history of coronary artery disease, hypertension, hyperlipidemia, prior stroke who reports gradually worsening right hand pain and swelling beginning 1 week PTA; [facility] and was told "nothing is wrong." Patient is right-handed. She felt like the issue started as a hang nail and worsened over time, resulting in swelling of the right first finger. Denies any injury. No drainage.  She reports subjective fever and chills.  No nausea, vomiting or diarrhea. She is not a diabetic.  She does not have a PCP.   ROS: See HPI Constitutional:  fever  Eyes: no drainage  ENT: no runny nose   Cardiovascular:  no chest pain  Resp: no SOB  GI: no vomiting GU: no dysuria Integumentary: no rash  Allergy: no hives  Musculoskeletal: no leg swelling  Neurological: no slurred speech ROS otherwise negative  PAST MEDICAL HISTORY/PAST SURGICAL HISTORY:  Past Medical History  Diagnosis Date  . Coronary artery disease   . Depression   . Blindness of right eye   . Hyperlipidemia   . Hypertension   . Chronic back pain   . Stroke 2002  . MI (myocardial infarction) 2005    stents    MEDICATIONS:  Prior to Admission medications   Medication Sig Start Date End Date Taking? Authorizing Provider  aspirin 325 MG EC tablet Take 325 mg by mouth daily.      Historical Provider, MD  buPROPion (WELLBUTRIN SR) 150 MG 12 hr tablet Take 1 tablet (150 mg total) by mouth 2 (two) times daily. 12/04/12   Patriciaann ClanMary B Dixon, PA-C  diazepam (VALIUM) 10 MG tablet Take 10 mg by mouth every 6 (six) hours as needed. For anxiety    Historical Provider, MD  doxepin (SINEQUAN) 25 MG capsule Take 1 capsule (25 mg total) by mouth at bedtime. 12/04/12   Patriciaann ClanMary  B Dixon, PA-C  ibuprofen (ADVIL,MOTRIN) 200 MG tablet Take 200 mg by mouth daily as needed. For pain    Historical Provider, MD  lisinopril (PRINIVIL,ZESTRIL) 5 MG tablet Take 1 tablet (5 mg total) by mouth daily. 09/18/12   Patriciaann ClanMary B Dixon, PA-C  lisinopril (PRINIVIL,ZESTRIL) 5 MG tablet TAKE 1 TABLET ONCE DAILY. 11/08/12   Patriciaann ClanMary B Dixon, PA-C  pravastatin (PRAVACHOL) 40 MG tablet TAKE ONE TABLET DAILY AT BEDTIME. 11/08/12   Dorena BodoMary B Dixon, PA-C  traMADol (ULTRAM) 50 MG tablet TAKE (1) OR (2) TABLETS EVERY SIX HOURS AS NEEDED FOR PAIN. 12/04/12   Dorena BodoMary B Dixon, PA-C    ALLERGIES:  Allergies  Allergen Reactions  . Codeine Rash    SOCIAL HISTORY:  History  Substance Use Topics  . Smoking status: Former Smoker -- 0.50 packs/day for 37 years    Types: Cigarettes  . Smokeless tobacco: Never Used  . Alcohol Use: No    FAMILY HISTORY: Family History  Problem Relation Age of Onset  . Stroke Other   . Seizures Other   . Heart failure Other   . Cancer Other   . Asthma Other   . Stroke Mother   . Heart disease Father   . Heart disease Sister   . Heart disease Brother   . Heart disease Brother   .  Stroke Brother     EXAM: BP 138/87 mmHg  Pulse 93  Temp(Src) 99.1 F (37.3 C) (Oral)  Resp 18  Ht 5\' 1"  (1.549 m)  Wt 130 lb (58.968 kg)  BMI 24.58 kg/m2  SpO2 100%  LMP 05/03/2010 CONSTITUTIONAL: Alert and oriented and responds appropriately to questions. Well-appearing; well-nourished HEAD: Normocephalic EYES: Conjunctivae clear, PERRL ENT: normal nose; no rhinorrhea; moist mucous membranes; pharynx without lesions noted NECK: Supple, no meningismus, no LAD  CARD: RRR; S1 and S2 appreciated; no murmurs, no clicks, no rubs, no gallops RESP: Normal chest excursion without splinting or tachypnea; breath sounds clear and equal bilaterally; no wheezes, no rhonchi, no rales,  ABD/GI: Normal bowel sounds; non-distended; soft, non-tender, no rebound, no guarding BACK:  The back appears normal  and is non-tender to palpation, there is no CVA tenderness EXT: Normal ROM in all joints; non-tender to palpation; no edema; normal capillary refill; no cyanosis    RIGHT HAND: significant swelling and over the right index finger; large blister and ecchymosis over entire dorsal aspect of right index finger, delayed capillary refill; bounding radial and ulnar pulses on right; TTP over flexor tendon on right index finger with no obvious erythema or warmth to the palmar aspect SKIN: Normal color for age and race; warm NEURO: Moves all extremities equally PSYCH: The patient's mood and manner are appropriate. Grooming and personal hygiene are appropriate.  MEDICAL DECISION MAKING: Pt here with a possible infection to the right index finger. Significantly swollen with a large blister but she does have streaking erythema up the dorsal hand. She has decreased capillary refill in this index finger but likely secondary to significant swelling. She does have bounding radial and ulnar pulses and normal capillary refill in all other fingers. No history of injury. She states she has had subjective fevers and chills at home and has an oral temperature 99.1 in the emergency department. We'll check rectal temp. We'll give IV vancomycin. We'll obtain basic labs and a right hand x-ray. We'll discuss with hand surgery on call.  ED PROGRESS: 4:00 PM  Discussed with Dr. Melvyn Novasrtmann with hand surgery who agrees to see the patient in consult. He agrees with admission to medicine and [facility]. Patient does not have a primary care physician. We'll discuss with hospitalist [facility]. She does have a leukocytosis. Her x-ray shows no sign of bony destruction consistent with osteomyelitis.  No fracture dislocation of the index finger. There does appear to be a nondisplaced fracture of the distal phalanx of the thumb but she is nontender in this area, denies any injury.   4:17 PM  D/w Dr. Konrad DoloresMerrell  with hospitalist service at [facility] for admission and [facility].  Discussed with patient who agrees with plan.   4:30 PM  [facility] will be Dr. Burney GauzeP Singh.   I personally performed the services described in this documentation, which was scribed in my presence. The recorded information has been reviewed and is accurate.     Layla MawKristen N Ward, DO 04/03/14 1630

## 2014-04-03 NOTE — Anesthesia Preprocedure Evaluation (Addendum)
Anesthesia Evaluation  Patient identified by MRN, date of birth, ID band Patient awake    Reviewed: Allergy & Precautions, H&P , NPO status , Patient's Chart, lab work & pertinent test results  Airway Mallampati: II   Neck ROM: full    Dental  (+) Edentulous Upper, Edentulous Lower   Pulmonary former smoker,          Cardiovascular hypertension, + CAD, + Past MI and + Cardiac Stents     Neuro/Psych Depression CVA    GI/Hepatic (+)     substance abuse  alcohol use, Hepatitis -, C  Endo/Other    Renal/GU      Musculoskeletal   Abdominal   Peds  Hematology   Anesthesia Other Findings   Reproductive/Obstetrics                            Anesthesia Physical Anesthesia Plan  ASA: III and emergent  Anesthesia Plan: General   Post-op Pain Management:    Induction: Intravenous  Airway Management Planned: Oral ETT  Additional Equipment:   Intra-op Plan:   Post-operative Plan: Extubation in OR  Informed Consent: I have reviewed the patients History and Physical, chart, labs and discussed the procedure including the risks, benefits and alternatives for the proposed anesthesia with the patient or authorized representative who has indicated his/her understanding and acceptance.     Plan Discussed with: CRNA, Anesthesiologist and Surgeon  Anesthesia Plan Comments:         Anesthesia Quick Evaluation

## 2014-04-03 NOTE — ED Notes (Signed)
Pt's rt hand swollen, red, hot to touch, rt index finger black/gray but warm, cap refill greater than 3 seconds. Pt states she is unsure what happened.
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
## 2014-04-03 NOTE — Anesthesia Procedure Notes (Signed)
Procedure Name: Intubation Date/Time: 04/03/2014 11:28 PM Performed by: Molli HazardGORDON, Shirel Mallis M Pre-anesthesia Checklist: Patient identified, Emergency Drugs available, Suction available and Patient being monitored Patient Re-evaluated:Patient Re-evaluated prior to inductionOxygen Delivery Method: Circle system utilized Preoxygenation: Pre-oxygenation with 100% oxygen Intubation Type: IV induction, Rapid sequence and Cricoid Pressure applied Laryngoscope Size: Miller and 2 Grade View: Grade I Tube type: Oral Tube size: 7.5 mm Number of attempts: 1 Airway Equipment and Method: Stylet Placement Confirmation: ETT inserted through vocal cords under direct vision,  positive ETCO2 and breath sounds checked- equal and bilateral Secured at: 21 cm Tube secured with: Tape Dental Injury: Teeth and Oropharynx as per pre-operative assessment

## 2014-04-03 NOTE — Progress Notes (Signed)
Pharmacy Note: PHARMACY PRELIMINARY NOTE:  Initial antibiotics:  Vancomycin 1000MG  IV x 1 ordered while pt in ED or cellulitis  Allergies  Allergen Reactions  . Codeine Rash    Filed Vitals:   04/03/14 1255  BP: 138/87  Pulse: 93  Temp: 99.1 F (37.3 C)  Resp: 18    Anti-infectives    Start     Dose/Rate Route Frequency Ordered Stop   04/03/14 1600  vancomycin (VANCOCIN) IVPB 1000 mg/200 mL premix     1,000 mg200 mL/hr over 60 Minutes Intravenous  Once 04/03/14 1519       Plan: Initial doses of Vancomycin 1000mg   X 1 ordered. F/U labs and admission orders for further dosing if therapy continued.  Wayland DenisHall, Jemar Paulsen A, [facility]RPH 04/03/2014 3:21 PM

## 2014-04-03 NOTE — ED Notes (Signed)
Call[facility]

## 2014-04-03 NOTE — Consult Note (Signed)
Reason for Consult:RIGHT INDEX FINGER INFECTION Referring Physician: DR. [name] is an 48 y.o. female.  HPI: R index finger pain and swelling. Getting worse. Seen at [facility] 3-4 days ago and told that there was nothing wrong per the pt. Associated w/ fevers adn chills. Denies nausea, vomiting, diarrhea, CP, SOB. Denies trauma to the hand. Has not taken any medications or tried any therapies to make this better.   Past Medical History  Diagnosis Date  . Coronary artery disease   . Depression   . Blindness of right eye   . Hyperlipidemia   . Hypertension   . Chronic back pain   . Stroke 2002  . MI (myocardial infarction) 2005    stents    Past Surgical History  Procedure Laterality Date  . Coronary stent placement      Family History  Problem Relation Age of Onset  . Stroke Other   . Seizures Other   . Heart failure Other   . Cancer Other   . Asthma Other   . Stroke Mother   . Heart disease Father   . Heart disease Sister   . Heart disease Brother   . Heart disease Brother   . Stroke Brother     Social History:  reports that she has quit smoking. Her smoking use included Cigarettes. She has a 18.5 pack-year smoking history. She has never used smokeless tobacco. She reports that she does not drink alcohol or use illicit drugs.  Allergies:  Allergies  Allergen Reactions  . Codeine Rash    Medications: I have reviewed the patient's current medications.  Results for orders placed or performed during the hospital encounter of 04/03/14 (from the past 48 hour(s))  CBC with Differential     Status: Abnormal   Collection Time: 04/03/14  2:44 PM  Result Value Ref Range   WBC 12.4 (H) 4.0 - 10.5 K/uL   RBC 4.70 3.87 - 5.11 MIL/uL   Hemoglobin 14.6 12.0 - 15.0 g/dL   HCT 43.1 36.0 - 46.0 %   MCV 91.7 78.0 - 100.0 fL   MCH 31.1 26.0 - 34.0 pg   MCHC 33.9 30.0 - 36.0 g/dL   RDW 12.6 11.5 - 15.5 %   Platelets 296 150 - 400 K/uL   Neutrophils  Relative % 75 43 - 77 %   Neutro Abs 9.3 (H) 1.7 - 7.7 K/uL   Lymphocytes Relative 18 12 - 46 %   Lymphs Abs 2.2 0.7 - 4.0 K/uL   Monocytes Relative 7 3 - 12 %   Monocytes Absolute 0.8 0.1 - 1.0 K/uL   Eosinophils Relative 0 0 - 5 %   Eosinophils Absolute 0.0 0.0 - 0.7 K/uL   Basophils Relative 0 0 - 1 %   Basophils Absolute 0.0 0.0 - 0.1 K/uL  Basic metabolic panel     Status: Abnormal   Collection Time: 04/03/14  2:44 PM  Result Value Ref Range   Sodium 137 137 - 147 mEq/L   Potassium 3.9 3.7 - 5.3 mEq/L   Chloride 99 96 - 112 mEq/L   CO2 27 19 - 32 mEq/L   Glucose, Bld 109 (H) 70 - 99 mg/dL   BUN 8 6 - 23 mg/dL   Creatinine, Ser 0.62 0.50 - 1.10 mg/dL   Calcium 9.4 8.4 - 10.5 mg/dL   GFR calc non Af Amer >90 >90 mL/min   GFR calc Af Amer >90 >90 mL/min    Comment: (NOTE)  The eGFR has been calculated using the CKD EPI equation. This calculation has not been validated in all clinical situations. eGFR's persistently <90 mL/min signify possible Chronic Kidney Disease.    Anion gap 11 5 - 15    Dg Hand Complete Right  04/03/2014   CLINICAL DATA:  48 year old with right hand pain and swelling  EXAM: RIGHT HAND - COMPLETE 3+ VIEW  COMPARISON:  None.  FINDINGS: There is diffuse soft tissue swelling involving the second digit. No associated acute fracture or dislocation is identified. There is no associated periosteal reaction, suspicious for osteomyelitis. There is no subcutaneous emphysema.  A linear lucency involving the lateral aspect of the of the base of the distal phalanx of the thumb may represent an intra-articular nondisplaced fracture.  The bone mineralization is normal.  IMPRESSION: 1. Second digit soft tissue swelling without evidence of an acute fracture or dislocation. 2. Probable nondisplaced fracture involving the distal phalanx of the thumb.   Electronically Signed   By: Rosemarie Ax   On: 04/03/2014 15:36    ROS AS NOTED IN THE PATIENTS CHART Blood pressure  111/73, pulse 88, temperature 98.4 F (36.9 C), temperature source Oral, resp. rate 21, height '5\' 1"'  (1.549 m), weight 58.968 kg (130 lb), last menstrual period 05/03/2010, SpO2 97 %. Physical Exam  General Appearance:  Alert, cooperative, no distress, appears stated age  Head:  Normocephalic, without obvious abnormality, atraumatic  Eyes:  Pupils equal, conjunctiva/corneas clear,         Throat: Lips, mucosa, and tongue normal; teeth and gums normal  Neck: No visible masses     Lungs:   respirations unlabored  Chest Wall:  No tenderness or deformity  Heart:  Regular rate and rhythm,  Abdomen:   Soft, non-tender,         Extremities: RIGHT INDEX FINGER LARGE ABSCESS OVER DORSUM OF FINGER, MARKED FLUID COLLECTION FINGER WARM WELL PERFUSED NO WOUNDS TO LONG/RING/SMALL FINGER ASCENDING ERYTHEMA AND EPITROCHLEAR NODES PRESENT DORSAL SWELLING OF HAND AND REDNESS  Pulses: 2+ and symmetric  Skin: Skin color, texture, turgor normal, no rashes or lesions     Neurologic: Normal    Assessment/Plan: RIGHT INDEX FINGER ABSCESS  RIGHT INDEX FINGER INCISION AND DRAINAGE AND WOUND DEBRIDEMENT  R/B/A DISCUSSED WITH PT [place].  PT VOICED UNDERSTANDING OF PLAN CONSENT SIGNED DAY OF SURGERY PT SEEN AND EXAMINED PRIOR TO OPERATIVE PROCEDURE/DAY OF SURGERY SITE MARKED. QUESTIONS ANSWERED WILL REMAIN AN INPATIENT  FOLLOWING SURGERY  WE ARE PLANNING SURGERY FOR YOUR UPPER EXTREMITY. THE RISKS AND BENEFITS OF SURGERY INCLUDE BUT NOT LIMITED TO BLEEDING INFECTION, DAMAGE TO NEARBY NERVES ARTERIES TENDONS, FAILURE OF SURGERY TO ACCOMPLISH ITS INTENDED GOALS, PERSISTENT SYMPTOMS AND NEED FOR FURTHER SURGICAL INTERVENTION. WITH THIS IN MIND WE WILL PROCEED. I HAVE DISCUSSED WITH THE PATIENT THE PRE AND POSTOPERATIVE REGIMEN AND THE DOS AND DON'TS. PT VOICED UNDERSTANDING AND INFORMED CONSENT SIGNED.  [name] 04/03/2014, 10:55 PM

## 2014-04-03 NOTE — Progress Notes (Signed)
ANTIBIOTIC CONSULT NOTE - INITIAL  Pharmacy Consult for Vancomycin Indication: Cellulitis  Allergies  Allergen Reactions  . Codeine Rash    Patient Measurements: Height: 5\' 1"  (154.9 cm) Weight: 130 lb (58.968 kg) IBW/kg (Calculated) : 47.8 Adjusted Body Weight:   Vital Signs: Temp: 99.1 F (37.3 C) (11/18 1255) Temp Source: Oral (11/18 1255) BP: 138/87 mmHg (11/18 1255) Pulse Rate: 93 (11/18 1255) Intake/Output from previous day:   Intake/Output from this shift:    Labs:  Recent Labs  04/03/14 1444  WBC 12.4*  HGB 14.6  PLT 296  CREATININE 0.62   Estimated Creatinine Clearance: 70.2 mL/min (by C-G formula based on Cr of 0.62). No results for input(s): VANCOTROUGH, VANCOPEAK, VANCORANDOM, GENTTROUGH, GENTPEAK, GENTRANDOM, TOBRATROUGH, TOBRAPEAK, TOBRARND, AMIKACINPEAK, AMIKACINTROU, AMIKACIN in the last 72 hours.   Microbiology: No results found for this or any previous visit (from the past 720 hour(s)).  Medical History: Past Medical History  Diagnosis Date  . Coronary artery disease   . Depression   . Blindness of right eye   . Hyperlipidemia   . Hypertension   . Chronic back pain   . Stroke 2002  . MI (myocardial infarction) 2005    stents    Medications:  Scheduled:  .  HYDROmorphone (DILAUDID) injection  1 mg Intravenous Once  . [START ON 04/04/2014] vancomycin  750 mg Intravenous Q12H   Assessment: Right hand swollen, right index finger black/grey, warm Vancomycin for cellulitis Vancomycin 1 GM IV given in ED  Goal of Therapy:  Vancomycin trough level 10-15 mcg/ml  Plan:  Vancomycin 750 mg IV every 12 hours, next dose 04/04/2014 4 AM Vancomycin trough at steady state Monitor renal function Labs per protocol  Raquel JamesPittman, Brett Darko Bennett 04/03/2014,3:48 PM

## 2014-04-03 NOTE — ED Notes (Signed)
MD at bedside. 

## 2014-04-03 NOTE — ED Notes (Signed)
[facility]  Per Gala Romneyoug,"  it may be latter on tonight before they can send a truck".  Nurse informed, and requested me to call [facility].

## 2014-04-03 NOTE — ED Notes (Signed)
Per hospitalist .  Okay for pt to eat something.  Pt ate pack of crackers.

## 2014-04-03 NOTE — ED Notes (Signed)
Left  AC area red.  D/c'd IV and restarted in Left upper arm.

## 2014-04-03 NOTE — Brief Op Note (Signed)
04/03/2014 - 04/04/2014  10:57 PM  PATIENT:  [name]  48 y.o. female  PRE-OPERATIVE DIAGNOSIS:  Right Finger Abscess  POST-OPERATIVE DIAGNOSIS:  * No post-op diagnosis entered *  PROCEDURE:  Procedure(s): IRRIGATION AND DEBRIDEMENT EXTREMITY (Right)  SURGEON:  Surgeon(s) and Role:    * Sharma CovertFred W Keyuana Wank, MD - Primary  PHYSICIAN ASSISTANT:   ASSISTANTS: none   ANESTHESIA:   general  EBL:     BLOOD ADMINISTERED:none  DRAINS: none   LOCAL MEDICATIONS USED:  MARCAINE     SPECIMEN:  No Specimen  DISPOSITION OF SPECIMEN:  N/A  COUNTS:  YES  TOURNIQUET:  * No tourniquets in log *  DICTATION: .[id number]  PLAN OF CARE: Admit to inpatient   PATIENT DISPOSITION:  PACU - hemodynamically stable.   Delay start of Pharmacological VTE agent (>24hrs) due to surgical blood loss or risk of bleeding: not applicable

## 2014-04-04 LAB — CBC
HEMATOCRIT: 32.8 % — AB (ref 36.0–46.0)
Hemoglobin: 10.6 g/dL — ABNORMAL LOW (ref 12.0–15.0)
MCH: 29.9 pg (ref 26.0–34.0)
MCHC: 32.3 g/dL (ref 30.0–36.0)
MCV: 92.7 fL (ref 78.0–100.0)
Platelets: 225 10*3/uL (ref 150–400)
RBC: 3.54 MIL/uL — ABNORMAL LOW (ref 3.87–5.11)
RDW: 12.7 % (ref 11.5–15.5)
WBC: 7.2 10*3/uL (ref 4.0–10.5)

## 2014-04-04 LAB — BASIC METABOLIC PANEL
Anion gap: 12 (ref 5–15)
BUN: 8 mg/dL (ref 6–23)
CO2: 25 meq/L (ref 19–32)
CREATININE: 0.82 mg/dL (ref 0.50–1.10)
Calcium: 8.2 mg/dL — ABNORMAL LOW (ref 8.4–10.5)
Chloride: 103 mEq/L (ref 96–112)
GFR calc non Af Amer: 83 mL/min — ABNORMAL LOW (ref >90)
GLUCOSE: 119 mg/dL — AB (ref 70–99)
Potassium: 4 mEq/L (ref 3.7–5.3)
Sodium: 140 mEq/L (ref 137–147)

## 2014-04-04 LAB — PROCALCITONIN: Procalcitonin: <0.1 ng/mL

## 2014-04-04 LAB — LACTIC ACID, PLASMA: Lactic Acid, Venous: 1 mmol/L (ref 0.5–2.2)

## 2014-04-04 MED ORDER — LACTATED RINGERS IV SOLN
INTRAVENOUS | Status: DC | PRN
  Administered 2014-04-03: 23:00:00 via INTRAVENOUS

## 2014-04-04 MED ORDER — PNEUMOCOCCAL VAC POLYVALENT 25 MCG/0.5ML IJ INJ
0.5000 mL | INJECTION | INTRAMUSCULAR | Status: AC
  Administered 2014-04-05: 0.5 mL via INTRAMUSCULAR
  Filled 2014-04-04 (×2): qty 0.5

## 2014-04-04 MED ORDER — OXYCODONE HCL 5 MG PO TABS: 5.0000 mg | ORAL_TABLET | Freq: Once | ORAL | Status: DC | PRN

## 2014-04-04 MED ORDER — OXYCODONE-ACETAMINOPHEN 5-325 MG PO TABS
1.0000 | ORAL_TABLET | ORAL | Status: DC | PRN
  Administered 2014-04-04 – 2014-04-08 (×12): 1 via ORAL
  Filled 2014-04-04 (×12): qty 1

## 2014-04-04 MED ORDER — SODIUM CHLORIDE 0.9 % IV BOLUS (SEPSIS)
1000.0000 mL | Freq: Once | INTRAVENOUS | Status: AC
  Administered 2014-04-04: 1000 mL via INTRAVENOUS

## 2014-04-04 MED ORDER — NICOTINE 21 MG/24HR TD PT24
21.0000 mg | MEDICATED_PATCH | Freq: Every day | TRANSDERMAL | Status: DC
  Administered 2014-04-04 – 2014-04-08 (×5): 21 mg via TRANSDERMAL
  Filled 2014-04-04 (×5): qty 1

## 2014-04-04 MED ORDER — HYDROMORPHONE HCL 1 MG/ML IJ SOLN
1.0000 mg | INTRAMUSCULAR | Status: DC | PRN
  Administered 2014-04-04 – 2014-04-08 (×18): 1 mg via INTRAVENOUS
  Filled 2014-04-04 (×18): qty 1

## 2014-04-04 MED ORDER — INFLUENZA VAC SPLIT QUAD 0.5 ML IM SUSY
0.5000 mL | PREFILLED_SYRINGE | INTRAMUSCULAR | Status: AC
  Administered 2014-04-05: 0.5 mL via INTRAMUSCULAR
  Filled 2014-04-04: qty 0.5

## 2014-04-04 MED ORDER — MORPHINE SULFATE 4 MG/ML IJ SOLN
4.0000 mg | Freq: Once | INTRAMUSCULAR | Status: AC
  Administered 2014-04-04: 4 mg via INTRAVENOUS
  Filled 2014-04-04: qty 1

## 2014-04-04 MED ORDER — HYDROMORPHONE HCL 1 MG/ML IJ SOLN: 0.2500 mg | INTRAMUSCULAR | Status: DC | PRN

## 2014-04-04 MED ORDER — OXYCODONE HCL 5 MG/5ML PO SOLN: 5.0000 mg | Freq: Once | ORAL | Status: DC | PRN

## 2014-04-04 MED ORDER — ONDANSETRON HCL 4 MG/2ML IJ SOLN: 4.0000 mg | Freq: Four times a day (QID) | INTRAMUSCULAR | Status: DC | PRN

## 2014-04-04 NOTE — Progress Notes (Signed)
Md made aware of the low SBP,new orders noted.

## 2014-04-04 NOTE — Op Note (Signed)
NAMMolly Simmons:  Howson, Daniya              ACCOUNT NO.:  [id number]  MEDICAL RECORD NO.:  [id number]  LOCATION:  [id number]                        FACILITY:  [facility]  PHYSICIAN:  Madelynn DoneFred W Leler Brion IV, MD  DATE OF BIRTH:  01-07-1966  DATE OF PROCEDURE:  04/04/2014 DATE OF DISCHARGE:                              OPERATIVE REPORT   PREOPERATIVE DIAGNOSIS:  Right index finger deep abscess with concern for impending compartment syndrome of the finger.  POSTOPERATIVE DIAGNOSIS:  Right index finger deep abscess with concern for impending compartment syndrome of the finger.  ATTENDING PHYSICIAN:  Madelynn DoneFred W Yolandra Habig IV, MD, who was scrubbed and present for the entire procedure.  ASSISTANT SURGEON:  None.  ANESTHESIA:  General via endotracheal tube.  SURGICAL PROCEDURES: 1. Incision and drainage of complex right ring finger abscess. 2. Debridement of skin and subcutaneous tissue, excisional debridement     of right index finger. 3. Right index finger decompressive fasciotomy.  SURGICAL INDICATIONS:  Mrs. [name] is a right hand-dominant female who [facility] with a worsening infection of the right index finger.  The patient was seen and [facility]  The patient was seen and evaluated, and given the severity of her injuries and recommended to initially seeing her on the floor.  She undergo emergent surgery.  Risks, benefits and alternatives were discussed in detail with the patient and signed informed consent was obtained.  Risks include, but not limited to bleeding; infection; damage to nearby nerves, arteries, or tendons; loss of motion of the wrist and digits; incomplete relief of symptoms; persistent infection; need for further surgical intervention.  DESCRIPTION OF PROCEDURE:  The patient was properly identified in the preoperative holding area and marked with a permanent marker made on the right index finger to indicate the correct side.   The patient was then brought back to the operating room, placed supine on the anesthesia room table where general anesthesia was administered, the patient tolerated this well.  A well-padded tourniquet was then placed on the right forearm and brachium, and sealed with 1000-drape.  Right upper extremity was then prepped and draped in normal sterile fashion.  Time-out was called, the correct side was identified, and procedure was then begun. Attention was then turned to the right index finger.  The patient did have the significant soft tissue swelling with the vascular compromise distally and longitudinal incision was made directly over the index finger from the nail and the nail plate was removed.  This incision was then extended to the entire length of the finger and then gross purulence was encountered.  Incision and drainage of the abscess was then carried out.  Dissection was carried down to the extensor mechanism, excisional debridement of the skin and subcutaneous tissue all the way down to the extensor mechanism, it was then carried out sharply.  This was done with small knife and rongeur.  After excisional debridement of the finger and drainage of the abscess area, the decompressive fasciotomy had been completed, releasing the finger toward the volar direction.  After released, the wound was then thoroughly irrigated.  Copious wound irrigation was run throughout the finger.  The finger  was then loosely reapproximated and closed over drain and small vessel loop.  Xeroform dressing and sterile compressive bandage were then applied.  The patient was placed in a well-padded volar splint, extubated, and taken to the recovery room in good condition.  INTRAOPERATIVE CULTURES:  Aerobic and anaerobic cultures were taken.  POSTPROCEDURAL PLAN:  The patient will be admitted back to the Internal Medicine Service, look at the wound within 48 hours, then begin hydrotherapy and wound care with  PT and then likely try to establish some form of wound care and depending on the resources available for the patient.     Madelynn DoneFred W Jeraline Marcinek IV, MD     FWO/MEDQ  D:  04/04/2014  T:  04/04/2014  Job:  [id number]

## 2014-04-04 NOTE — Transfer of Care (Signed)
Immediate Anesthesia Transfer of Care Note  Patient: [name]  Procedure(s) Performed: Procedure(s): IRRIGATION AND DEBRIDEMENT right index finger (Right)  Patient Location: PACU  Anesthesia Type:General  Level of Consciousness: awake, alert  and oriented  Airway & Oxygen Therapy: Patient connected to nasal cannula oxygen  Post-op Assessment: Report given to PACU RN, Post -op Vital signs reviewed and stable and Patient moving all extremities X 4  Post vital signs: Reviewed and stable  Complications: No apparent anesthesia complications
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
## 2014-04-04 NOTE — Progress Notes (Signed)
Utilization Review Completed.[name] T11/19/2015  

## 2014-04-04 NOTE — Anesthesia Postprocedure Evaluation (Signed)
Anesthesia Post Note  Patient: [name]  Procedure(s) Performed: Procedure(s) (LRB): IRRIGATION AND DEBRIDEMENT right index finger (Right)  Anesthesia type: General  Patient location: PACU  Post pain: Pain level controlled and Adequate analgesia  Post assessment: Post-op Vital signs reviewed, Patient's Cardiovascular Status Stable, Respiratory Function Stable, Patent Airway and Pain level controlled  Last Vitals:  Filed Vitals:   04/04/14 0030  BP: 125/84  Pulse: 77  Temp:   Resp: 12    Post vital signs: Reviewed and stable  Level of consciousness: awake, alert  and oriented  Complications: No apparent anesthesia complications

## 2014-04-05 ENCOUNTER — Encounter (HOSPITAL_COMMUNITY): Payer: Self-pay | Admitting: Orthopedic Surgery

## 2014-04-05 NOTE — Care Management (Addendum)
04-05-14 Request to set up patient for Wound Care Center at [facility]. Called David at wound [facility] , was told there is not a wound care center at [facility].   [facility] is [facility] [place] [facility] [phone] , they close every Friday at noon.   DR Melvyn Novasrtmann aware . Patient needs daily dressing change x 1 week , Zeroform with splint .   Patient unable to change dressing herself .   DR Rai aware . Called [facility] , patient is unable to have daily visits from home health nurse.   Ronny FlurryHeather Birgitta Uhlir RN BSN [phone]
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
## 2014-04-05 NOTE — Addendum Note (Signed)
Addendum  created 04/05/14 0901 by Molli Hazardheresa M Aubert Choyce, CRNA   Modules edited: Anesthesia Medication Administration

## 2014-04-05 NOTE — Progress Notes (Signed)
PT SEEN/EXAMINED WOUND DRESSED AND DRAINED REMOVED  FINGERS WARM WELL PERFUSED LOOKS MUCH BETTER OK FOR PATIENT TO GO HOME AND HAVE OUTPATIENT WOUND CARE AT [facility] CAN F/U WITH ME IN ONE WEEK AS LONG AS SHE HAS OUTPATIENT WOUND CARE ESTABLISHED OUTPATIENT DAILY DRESSING CHANGES XEROFORM DRESSING TO FINGER AND CONTINUE WITH SPLINT TO KEEP HAND STILL,IMMOBILE ORAL ABX PER PRIMARY TEAM CASE DISCUSSED WITH CASE MANAGER CONTACT [phone] IF QUESTIONS I WILL BE ABLE TO BE REACHED BY PHONE THIS WEEKEND DO NOT CALL OFFICE

## 2014-04-05 NOTE — Plan of Care (Signed)
Problem: Phase III Progression Outcomes Goal: Activity at appropriate level-compared to baseline (UP IN CHAIR FOR HEMODIALYSIS)  Outcome: Completed/Met Date Met:  04/05/14 Goal: Voiding independently Outcome: Completed/Met Date Met:  04/05/14

## 2014-04-06 LAB — CULTURE, ROUTINE-ABSCESS

## 2014-04-06 LAB — PROCALCITONIN: Procalcitonin: <0.1 ng/mL

## 2014-04-06 NOTE — Progress Notes (Signed)
Patient asking to go home. Dr. Isidoro Donningai called. States patient can leave AMA but patient will take responsibility if she leaves and loses hand. Told Dr. 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 Explained to patient what Dr. Isidoro Donningai said. She agrees to stay at present time. Trina Aoarla Sonja Manseau, RN

## 2014-04-06 NOTE — Progress Notes (Signed)
ANTIBIOTIC CONSULT NOTE - FOLLOW UP  Pharmacy Consult for Vancomycin Indication: R-index finger cellulitis  Allergies  Allergen Reactions  . Codeine Rash    Patient Measurements: Height: 5\' 1"  (154.9 cm) Weight: 130 lb (58.968 kg) IBW/kg (Calculated) : 47.8 Adjusted Body Weight:   Vital Signs: Temp: 97.2 F (36.2 C) (11/21 0607) Temp Source: Oral (11/21 16100607) BP: 87/57 mmHg (11/21 0607) Pulse Rate: 67 (11/21 0607) Intake/Output from previous day:   Intake/Output from this shift:    Labs:  Recent Labs  04/03/14 1444 04/04/14 1544  WBC 12.4* 7.2  HGB 14.6 10.6*  PLT 296 225  CREATININE 0.62 0.82   Estimated Creatinine Clearance: 68.5 mL/min (by C-G formula based on Cr of 0.82). No results for input(s): VANCOTROUGH, VANCOPEAK, VANCORANDOM, GENTTROUGH, GENTPEAK, GENTRANDOM, TOBRATROUGH, TOBRAPEAK, TOBRARND, AMIKACINPEAK, AMIKACINTROU, AMIKACIN in the last 72 hours.   Microbiology: Recent Results (from the past 720 hour(s))  Blood culture (routine x 2)     Status: None (Preliminary result)   Collection Time: 04/03/14  2:48 PM  Result Value Ref Range Status   Specimen Description BLOOD LEFT HAND  Final   Special Requests   Final    BOTTLES DRAWN AEROBIC AND ANAEROBIC AEB=8CC ANA=5CC   Culture NO GROWTH 3 DAYS  Final   Report Status PENDING  Incomplete  Blood culture (routine x 2)     Status: None (Preliminary result)   Collection Time: 04/03/14  2:53 PM  Result Value Ref Range Status   Specimen Description BLOOD LEFT ANTECUBITAL  Final   Special Requests   Final    BOTTLES DRAWN AEROBIC AND ANAEROBIC AEB=6CC ANA=4CC   Culture NO GROWTH 3 DAYS  Final   Report Status PENDING  Incomplete  Anaerobic culture     Status: None (Preliminary result)   Collection Time: 04/04/14 12:02 AM  Result Value Ref Range Status   Specimen Description ABSCESS RIGHT FINGER  Final   Special Requests NONE  Final   Gram Stain   Final    RARE WBC PRESENT, PREDOMINANTLY PMN NO  SQUAMOUS EPITHELIAL CELLS SEEN MODERATE GRAM POSITIVE COCCI IN PAIRS IN CLUSTERS Performed at [facility]    Culture   Final    NO ANAEROBES ISOLATED; CULTURE IN PROGRESS FOR 5 DAYS Performed at [facility]    Report Status PENDING  Incomplete  Culture, routine-abscess     Status: None (Preliminary result)   Collection Time: 04/04/14 12:02 AM  Result Value Ref Range Status   Specimen Description ABSCESS RIGHT FINGER  Final   Special Requests NONE  Final   Gram Stain PENDING  Incomplete   Culture   Final    ABUNDANT STAPHYLOCOCCUS AUREUS Note: RIFAMPIN AND GENTAMICIN SHOULD NOT BE USED AS SINGLE DRUGS FOR TREATMENT OF STAPH INFECTIONS. Performed at [facility]    Report Status PENDING  Incomplete    Anti-infectives    Start     Dose/Rate Route Frequency Ordered Stop   04/04/14 0400  vancomycin (VANCOCIN) IVPB 750 mg/150 ml premix     750 mg150 mL/hr over 60 Minutes Intravenous Every 12 hours 04/03/14 1547     04/03/14 1600  vancomycin (VANCOCIN) IVPB 1000 mg/200 mL premix     1,000 mg200 mL/hr over 60 Minutes Intravenous  Once 04/03/14 1519 04/03/14 1659      Assessment: 48yo female s/p I&D of R-finger 11/18 and decompressive fasciotomy on 11/19.  WBC wnl on 11/19, pt has been AFeb.  Cr < 1.  Pt has  been ok'd for d/c on po antibiotics per Hand Surgery.    Goal of Therapy:  Vancomycin trough level 15-20 mcg/ml  Plan:  F/U plans for discharge  Marisue HumbleKendra Mirely Pangle, PharmD Clinical Pharmacist [facility]

## 2014-04-06 NOTE — Plan of Care (Signed)
Problem: Phase I Progression Outcomes Goal: OOB as tolerated unless otherwise ordered Outcome: Completed/Met Date Met:  04/06/14 Walking in hallways
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
## 2014-04-06 NOTE — Plan of Care (Signed)
Problem: Phase I Progression Outcomes Goal: Voiding-avoid urinary catheter unless indicated Outcome: Not Applicable Date Met:  04/06/14     

## 2014-04-07 ENCOUNTER — Inpatient Hospital Stay (HOSPITAL_COMMUNITY): Payer: Medicaid Other

## 2014-04-07 DIAGNOSIS — A4902 Methicillin resistant Staphylococcus aureus infection, unspecified site: Secondary | ICD-10-CM

## 2014-04-07 DIAGNOSIS — L03113 Cellulitis of right upper limb: Secondary | ICD-10-CM

## 2014-04-07 DIAGNOSIS — L03011 Cellulitis of right finger: Secondary | ICD-10-CM

## 2014-04-07 DIAGNOSIS — S62509A Fracture of unspecified phalanx of unspecified thumb, initial encounter for closed fracture: Secondary | ICD-10-CM

## 2014-04-07 DIAGNOSIS — L02511 Cutaneous abscess of right hand: Principal | ICD-10-CM

## 2014-04-07 LAB — VANCOMYCIN, TROUGH: Vancomycin Tr: 9.9 ug/mL — ABNORMAL LOW (ref 10.0–20.0)

## 2014-04-07 LAB — BASIC METABOLIC PANEL
Anion gap: 10 (ref 5–15)
BUN: 8 mg/dL (ref 6–23)
CO2: 27 mEq/L (ref 19–32)
Calcium: 8.9 mg/dL (ref 8.4–10.5)
Chloride: 104 mEq/L (ref 96–112)
Creatinine, Ser: 0.74 mg/dL (ref 0.50–1.10)
GFR calc Af Amer: >90 mL/min (ref >90)
GFR calc non Af Amer: >90 mL/min (ref >90)
GLUCOSE: 106 mg/dL — AB (ref 70–99)
Potassium: 4.2 mEq/L (ref 3.7–5.3)
Sodium: 141 mEq/L (ref 137–147)

## 2014-04-07 NOTE — Plan of Care (Signed)
Problem: Phase I Progression Outcomes Goal: Pain controlled with appropriate interventions Outcome: Not Progressing States she wants to write down which pain medication she had and what time she can get it again.

## 2014-04-07 NOTE — Progress Notes (Signed)
ANTIBIOTIC CONSULT NOTE - FOLLOW UP  Pharmacy Consult for Vancomycin Indication: R-index finger cellulitis  Allergies  Allergen Reactions  . Codeine Rash    Patient Measurements: Height: 5\' 1"  (154.9 cm) Weight: 130 lb (58.968 kg) IBW/kg (Calculated) : 47.8 Adjusted Body Weight:   Vital Signs: Temp: 98.1 F (36.7 C) (11/22 1350) Temp Source: Oral (11/22 1350) BP: 99/60 mmHg (11/22 1350) Pulse Rate: 74 (11/22 1350) Intake/Output from previous day: 11/21 0701 - 11/22 0700 In: 840 [P.O.:840] Out: -  Intake/Output from this shift: Total I/O In: 240 [P.O.:240] Out: -   Labs:  Recent Labs  04/07/14 1525  CREATININE 0.74   Estimated Creatinine Clearance: 70.2 mL/min (by C-G formula based on Cr of 0.74).  Recent Labs  04/07/14 1525  VANCOTROUGH 9.9*     Microbiology: Recent Results (from the past 720 hour(s))  Blood culture (routine x 2)     Status: None (Preliminary result)   Collection Time: 04/03/14  2:48 PM  Result Value Ref Range Status   Specimen Description BLOOD LEFT HAND  Final   Special Requests   Final    BOTTLES DRAWN AEROBIC AND ANAEROBIC AEB=8CC ANA=5CC   Culture NO GROWTH 4 DAYS  Final   Report Status PENDING  Incomplete  Blood culture (routine x 2)     Status: None (Preliminary result)   Collection Time: 04/03/14  2:53 PM  Result Value Ref Range Status   Specimen Description BLOOD LEFT ANTECUBITAL  Final   Special Requests   Final    BOTTLES DRAWN AEROBIC AND ANAEROBIC AEB=6CC ANA=4CC   Culture NO GROWTH 4 DAYS  Final   Report Status PENDING  Incomplete  Anaerobic culture     Status: None (Preliminary result)   Collection Time: 04/04/14 12:02 AM  Result Value Ref Range Status   Specimen Description ABSCESS RIGHT FINGER  Final   Special Requests NONE  Final   Gram Stain   Final    RARE WBC PRESENT, PREDOMINANTLY PMN NO SQUAMOUS EPITHELIAL CELLS SEEN MODERATE GRAM POSITIVE COCCI IN PAIRS IN CLUSTERS Performed at [facility]    Culture   Final    NO ANAEROBES ISOLATED; CULTURE IN PROGRESS FOR 5 DAYS Performed at [facility]    Report Status PENDING  Incomplete  Culture, routine-abscess     Status: None   Collection Time: 04/04/14 12:02 AM  Result Value Ref Range Status   Specimen Description ABSCESS RIGHT FINGER  Final   Special Requests NONE  Final   Gram Stain   Final    RARE WBC PRESENT, PREDOMINANTLY PMN NO SQUAMOUS EPITHELIAL CELLS SEEN MODERATE GRAM POSITIVE COCCI IN PAIRS IN CLUSTERS Performed at [facility]    Culture   Final    ABUNDANT METHICILLIN RESISTANT STAPHYLOCOCCUS AUREUS Note: RIFAMPIN AND GENTAMICIN SHOULD NOT BE USED AS SINGLE DRUGS FOR TREATMENT OF STAPH INFECTIONS. This organism DOES NOT demonstrate inducible Clindamycin resistance in vitro. CRITICAL RESULT CALLED TO, READ BACK BY AND VERIFIED WITH: DARLA THOMPSON @  1028 ON [id number] BY [facility] Performed at [facility]    Report Status 04/06/2014 FINAL  Final   Organism ID, Bacteria METHICILLIN RESISTANT STAPHYLOCOCCUS AUREUS  Final      Susceptibility   Methicillin resistant staphylococcus aureus - MIC*    CLINDAMYCIN <=0.25 SENSITIVE Sensitive     ERYTHROMYCIN >=8 RESISTANT Resistant     GENTAMICIN <=0.5 SENSITIVE Sensitive     LEVOFLOXACIN 4 INTERMEDIATE Intermediate     OXACILLIN >=4 RESISTANT Resistant  PENICILLIN >=0.5 RESISTANT Resistant     RIFAMPIN <=0.5 SENSITIVE Sensitive     TRIMETH/SULFA <=10 SENSITIVE Sensitive     VANCOMYCIN 1 SENSITIVE Sensitive     TETRACYCLINE <=1 SENSITIVE Sensitive     * ABUNDANT METHICILLIN RESISTANT STAPHYLOCOCCUS AUREUS    Anti-infectives    Start     Dose/Rate Route Frequency Ordered Stop   04/04/14 0400  vancomycin (VANCOCIN) IVPB 750 mg/150 ml premix     750 mg150 mL/hr over 60 Minutes Intravenous Every 12 hours 04/03/14 1547     04/03/14 1600  vancomycin (VANCOCIN) IVPB 1000 mg/200 mL premix     1,000 mg200 mL/hr over 60 Minutes Intravenous  Once 04/03/14  1519 04/03/14 1659      Assessment: 48yo female s/p I&D of R-finger 11/18 and decompressive fasciotomy on 11/19.  WBC wnl on 11/19, pt has been AFeb.  Cr < 1.  Pt has been ok'd for d/c on po antibiotics per Hand Surgery.    Vancomycin trough is therapeutic at 9.9.  Goal of Therapy:  Vancomycin trough level 10-15 mcg/ml  Plan:  Per ID note, patient will be transitioned to doxycycline 100mg  PO BID Continue vancomycin 750mg  IV q12h until changed  Arlean Hoppingorey M. Newman PiesBall, PharmD Clinical Pharmacist Pager [phone]
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
## 2014-04-07 NOTE — Progress Notes (Signed)
Patient refused to allow me to change her dressing. States doctor said she will change it tomorrow. Trina Aoarla Ether Wolters, RN

## 2014-04-08 DIAGNOSIS — S62102A Fracture of unspecified carpal bone, left wrist, initial encounter for closed fracture: Secondary | ICD-10-CM

## 2014-04-08 LAB — BASIC METABOLIC PANEL
ANION GAP: 15 (ref 5–15)
BUN: 10 mg/dL (ref 6–23)
CHLORIDE: 106 meq/L (ref 96–112)
CO2: 21 meq/L (ref 19–32)
Calcium: 8.5 mg/dL (ref 8.4–10.5)
Creatinine, Ser: 0.64 mg/dL (ref 0.50–1.10)
GFR calc non Af Amer: >90 mL/min (ref >90)
Glucose, Bld: 106 mg/dL — ABNORMAL HIGH (ref 70–99)
POTASSIUM: 3.9 meq/L (ref 3.7–5.3)
Sodium: 142 mEq/L (ref 137–147)

## 2014-04-08 LAB — CULTURE, BLOOD (ROUTINE X 2)
CULTURE: NO GROWTH
Culture: NO GROWTH

## 2014-04-08 LAB — CBC
HEMATOCRIT: 36.7 % (ref 36.0–46.0)
HEMOGLOBIN: 11.8 g/dL — AB (ref 12.0–15.0)
MCH: 29.9 pg (ref 26.0–34.0)
MCHC: 32.2 g/dL (ref 30.0–36.0)
MCV: 92.9 fL (ref 78.0–100.0)
Platelets: 225 10*3/uL (ref 150–400)
RBC: 3.95 MIL/uL (ref 3.87–5.11)
RDW: 12.5 % (ref 11.5–15.5)
WBC: 5.7 10*3/uL (ref 4.0–10.5)

## 2014-04-08 LAB — PROCALCITONIN: Procalcitonin: <0.1 ng/mL

## 2014-04-08 LAB — HIV ANTIBODY (ROUTINE TESTING W REFLEX): HIV 1&2 Ab, 4th Generation: NONREACTIVE

## 2014-04-08 LAB — HEPATITIS B SURFACE ANTIGEN: Hepatitis B Surface Ag: NEGATIVE

## 2014-04-08 MED ORDER — NICOTINE 21 MG/24HR TD PT24: 21.0000 mg | MEDICATED_PATCH | Freq: Every day | TRANSDERMAL | Status: DC

## 2014-04-08 MED ORDER — OXYCODONE-ACETAMINOPHEN 5-325 MG PO TABS: 1.0000 | ORAL_TABLET | ORAL | Status: DC | PRN

## 2014-04-08 MED ORDER — DOXYCYCLINE HYCLATE 100 MG PO TABS: 100.0000 mg | ORAL_TABLET | Freq: Two times a day (BID) | ORAL | Status: AC

## 2014-04-08 MED ORDER — TRAMADOL HCL 50 MG PO TABS: 50.0000 mg | ORAL_TABLET | Freq: Four times a day (QID) | ORAL | Status: DC | PRN

## 2014-04-08 MED ORDER — ALPRAZOLAM 0.5 MG PO TABS: 0.5000 mg | ORAL_TABLET | Freq: Two times a day (BID) | ORAL | Status: DC | PRN

## 2014-04-08 MED ORDER — IBUPROFEN 600 MG PO TABS: 600.0000 mg | ORAL_TABLET | Freq: Four times a day (QID) | ORAL | Status: DC | PRN

## 2014-04-08 MED ORDER — SENNA 8.6 MG PO TABS: 1.0000 | ORAL_TABLET | Freq: Two times a day (BID) | ORAL | Status: DC

## 2014-04-08 MED ORDER — DOXYCYCLINE HYCLATE 100 MG PO TABS
100.0000 mg | ORAL_TABLET | Freq: Two times a day (BID) | ORAL | Status: DC
  Administered 2014-04-08: 100 mg via ORAL
  Filled 2014-04-08: qty 1

## 2014-04-08 MED ORDER — PROMETHAZINE HCL 12.5 MG PO TABS: 12.5000 mg | ORAL_TABLET | Freq: Four times a day (QID) | ORAL | Status: DC | PRN

## 2014-04-08 MED ORDER — POLYETHYLENE GLYCOL 3350 17 G PO PACK: 17.0000 g | PACK | Freq: Every day | ORAL | Status: DC | PRN

## 2014-04-08 NOTE — Progress Notes (Signed)
[id number] wants to go home with no splint on because she does not want her rings to be cut off. Unable to get rings off using soap, lotion and lubricant.
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
## 2014-04-08 NOTE — Discharge Instructions (Signed)
Patient has appointment at [facility] [place] , Apr 09, 2014 at 1130 am .  [address] , [address] , [place] , [place] [address] , phone [phone]

## 2014-04-08 NOTE — Care Management (Signed)
04-08-14 [facility] [place] [facility] does provide wound care . [facility] .    Patient has appointment at [facility] [place] , Apr 09, 2014 at 1130 am .  [address] , [address] , [place] , [place] [address] , phone [phone]   Information provided to patient .
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
## 2014-04-08 NOTE — Consult Note (Addendum)
WOC wound consult note Reason for Consult: Consult requested for right hand wound.  Pt has been followed by Dr Melvyn Novasrtmann of the hand surgery team and he previously performed the first post-op dressing on 11/20.  He has ordered pt to be followed by the outpatient [facility] and plans to follow up with the patient in one week.  According to progress notes, dressing is xeroform gauze, kerlex, and an arm splint. Wound type: Full thickness post-op wound to right index finger. Measurement: 7X3X.2cm Wound bed: 85% red, 15% yellow Drainage (amount, consistency, odor) Mod amt yellow drainage, no odor Periwound: Generalized edema and erythemia Dressing procedure/placement/frequency: Applied xeroform gauze double-folded to minimize pain and adherence to wound bed when dressing changed.  Pt tolerated with minimal discomfort.  ABD pads in place for padding, then right arm splint , kerlex, and acewrap to hold in place.  Gauze applied between fingers.  Arrangements have been made for patient to be [facility] tomorrow, according to the EMR. Pt denies further questions at this time. Please re-consult if further assistance is needed.  Thank-you,  Cammie Mcgeeawn Danasia Baker MSN, RN, CWOCN, MintoWCN-AP, CNS [phone]

## 2014-04-08 NOTE — Care Management (Signed)
Called [facility] to schedule transportation for patient for her appointment tomorrow .  Faxed required letter  indicating importance of patient  Making her appointment . Confirmed with Mardene CelesteJoanna at [facility] they received faxed . Mardene CelesteJoanna stated patient needs to call [facility] today after lunch to arrange a pick up time for tomorrow. Patient aware .   [facility] phone [phone] fax [phone].   Ronny FlurryHeather Quandre Polinski RN BSN [phone]

## 2014-04-09 ENCOUNTER — Ambulatory Visit (HOSPITAL_COMMUNITY)
Admission: EM | Admit: 2014-04-09 | Payer: Medicaid Other | Source: Home / Self Care | Attending: Internal Medicine | Admitting: Internal Medicine

## 2014-04-09 LAB — ANAEROBIC CULTURE

## 2014-04-09 NOTE — Progress Notes (Addendum)
Patient has called several people including the hospitalist service and the charge nurse on the unit where she had been discharged (6N) with issues related to transportation and her 1130am appt with [facility] for a dressing change to her hand that she DID NOT attend today.   I have called at 1554pm and left a voicemail at [facility] [phone]) in attempt to get pt a new appointment for tomorrow as I understand these dressings are to be changed daily.  Awaiting call back for this. Once an appointment is made, [facility] will need to know a pickup time for transportation.  Carlyle LipaMichelle Aracelis Ulrey, RN BSN MHA CCM  Case Manager, Trauma Service/Unit 10M [phone]  *ADDENDUM:  Finally reached [facility] and they rescheduled appt for 04/10/2014 at 1500.  I can't arrange transportation for the patient because [facility] needs a new letter faxed there detailing importance of the appointment and the letter faxed by the Case Manager yesterday was only good for one day.  I have contacted the attending MD and she advised that Dr. Melvyn Novasrtmann was the person I should speak with about this issue.  I have called the [facility] and left a voicemail about this but unlikely to get a call back until tomorrow because of the time (1630).  Ihave left a voicemail with the patient that there is an appt tomorrow if she can get someone to take her.  They would also need to be taught how to do these dressings since she will need to have it done 11/26 and 11/27 when the outpt clinic is closed.  I will speak with other CM tomorrow and we will readdress then.  Carlyle LipaMichelle Omauri Boeve

## 2014-04-10 ENCOUNTER — Ambulatory Visit (HOSPITAL_COMMUNITY)
Admission: RE | Admit: 2014-04-10 | Discharge: 2014-04-10 | Disposition: A | Discharge status: Home / Self Care | Payer: Medicaid Other | Source: Ambulatory Visit | Attending: Orthopedic Surgery | Admitting: Orthopedic Surgery

## 2014-04-10 DIAGNOSIS — Z5189 Encounter for other specified aftercare: Secondary | ICD-10-CM | POA: Diagnosis not present

## 2014-04-10 DIAGNOSIS — S61401A Unspecified open wound of right hand, initial encounter: Secondary | ICD-10-CM | POA: Diagnosis not present

## 2014-04-10 LAB — HCV RNA QUANT
HCV QUANT LOG: 6.5 {Log} — AB (ref <1.18)
HCV QUANT: 3169331 [IU]/mL — AB (ref <15)
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
## 2014-04-10 NOTE — Care Management (Addendum)
04-10-14 Spoke with DR Melvyn Novasrtmann . [facility] . [facility] [place] today and have dressing changed and return to [facility] [place] on Monday for dressing change. Called patient and left voice mail. Ronny FlurryHeather Lariya Kinzie RN BSN     04-10-14 See note from previous case manager .   Spoke with Angelica ChessmanMandy at [facility] in [place] , confirmed patient's appointment for today and they are closed Nov26, and Nov 27, 20015 .   Spoke with [facility] , refaxed a letter stating patient needs to make her appointment today . [facility] will call patient directly at [phone] to arrange pick up time .    Spoke to patient ( she answered [phone] ) , stressed the importance of her going to her appointment today and [facility] will call her for pick up time . However, if she does not hear from [facility] in 30 minutes for her to call [facility] . Patient stated she will call [facility] immediately to arrange a time.    Called DR Melvyn Novasrtmann  Office [phone] spoke with Eunice Blaseebbie. Explained to Debbie patient ,missed her appointment yesterday , wound center is closed Thursday and Friday .  NCM explored home health options , [facility] ( of course will need MD order) . Eunice BlaseDebbie will have Dr Melvyn Novasrtmann or his nurse call NCM back. This was also explained to Ms [name] .   Ronny FlurryHeather Adalyna Godbee RN BSN [phone]

## 2014-04-13 LAB — CULTURE, BLOOD (ROUTINE X 2)
Culture: NO GROWTH
Culture: NO GROWTH

## 2014-04-16 LAB — HEPATITIS C GENOTYPE

## 2014-04-30 NOTE — Addendum Note (Signed)
Encounter addended by: Doyne Keelash R Silas Sedam, PT on: 04/30/2014  8:18 AM<BR>     Documentation filed: Orders

## 2014-05-21 ENCOUNTER — Telehealth: Payer: Self-pay | Admitting: General Practice

## 2014-05-21 NOTE — Telephone Encounter (Addendum)
Patient has medicaid. She is currently percocet, alprazolam,suboxone. Patient aware that we would not be able to take her on as a patient since we would not be able to handle her controlled medications here.

## 2014-08-12 ENCOUNTER — Telehealth: Payer: Self-pay | Admitting: Family Medicine

## 2014-08-12 NOTE — Telephone Encounter (Signed)
Patient aware that we will not be able to take her on as a new patient.

## 2014-09-27 ENCOUNTER — Emergency Department (HOSPITAL_COMMUNITY)
Admission: EM | Admit: 2014-09-27 | Discharge: 2014-09-27 | Disposition: A | Discharge status: Home / Self Care | Payer: Medicaid Other | Attending: Emergency Medicine | Admitting: Emergency Medicine

## 2014-09-27 ENCOUNTER — Emergency Department (HOSPITAL_COMMUNITY): Payer: Medicaid Other

## 2014-09-27 ENCOUNTER — Encounter (HOSPITAL_COMMUNITY): Payer: Self-pay | Admitting: Family Medicine

## 2014-09-27 DIAGNOSIS — H5441 Blindness, right eye, normal vision left eye: Secondary | ICD-10-CM | POA: Diagnosis not present

## 2014-09-27 DIAGNOSIS — F329 Major depressive disorder, single episode, unspecified: Secondary | ICD-10-CM | POA: Insufficient documentation

## 2014-09-27 DIAGNOSIS — Z7982 Long term (current) use of aspirin: Secondary | ICD-10-CM | POA: Insufficient documentation

## 2014-09-27 DIAGNOSIS — I252 Old myocardial infarction: Secondary | ICD-10-CM | POA: Diagnosis not present

## 2014-09-27 DIAGNOSIS — E785 Hyperlipidemia, unspecified: Secondary | ICD-10-CM | POA: Insufficient documentation

## 2014-09-27 DIAGNOSIS — M25552 Pain in left hip: Secondary | ICD-10-CM | POA: Insufficient documentation

## 2014-09-27 DIAGNOSIS — Z79899 Other long term (current) drug therapy: Secondary | ICD-10-CM | POA: Insufficient documentation

## 2014-09-27 DIAGNOSIS — G8929 Other chronic pain: Secondary | ICD-10-CM | POA: Insufficient documentation

## 2014-09-27 DIAGNOSIS — R52 Pain, unspecified: Secondary | ICD-10-CM

## 2014-09-27 DIAGNOSIS — I2510 Atherosclerotic heart disease of native coronary artery without angina pectoris: Secondary | ICD-10-CM | POA: Diagnosis not present

## 2014-09-27 DIAGNOSIS — M545 Low back pain: Secondary | ICD-10-CM | POA: Insufficient documentation

## 2014-09-27 DIAGNOSIS — Z8673 Personal history of transient ischemic attack (TIA), and cerebral infarction without residual deficits: Secondary | ICD-10-CM | POA: Insufficient documentation

## 2014-09-27 DIAGNOSIS — Z87891 Personal history of nicotine dependence: Secondary | ICD-10-CM | POA: Diagnosis not present

## 2014-09-27 MED ORDER — KETOROLAC TROMETHAMINE 60 MG/2ML IM SOLN
30.0000 mg | Freq: Once | INTRAMUSCULAR | Status: AC
  Administered 2014-09-27: 30 mg via INTRAMUSCULAR
  Filled 2014-09-27: qty 2

## 2014-09-27 MED ORDER — MELOXICAM 15 MG PO TABS: 15.0000 mg | ORAL_TABLET | Freq: Every day | ORAL | Status: DC

## 2014-09-27 NOTE — ED Notes (Signed)
Pt here for left hip pain radiating into left leg. sts it has been going on for 1 year.

## 2014-09-27 NOTE — Discharge Instructions (Signed)
Return to the emergency room with worsening of symptoms, new symptoms or with symptoms that are concerning, especially fevers, redness, swelling, unable to move hip, weakness, unable to walk. RICE: Rest, Ice (three cycles of 20 mins on, 20mins off at least twice a day), compression/brace, elevation. Heating pad works well for back pain. mobic for pain for next 10 days as needed daily. Call to make follow up appointment with orthopedics. Cardiology appointment on Monday as scheduled. Read below information and follow recommendations. Hip Pain Your hip is the joint between your upper legs and your lower pelvis. The bones, cartilage, tendons, and muscles of your hip joint perform a lot of work each day supporting your body weight and allowing you to move around. Hip pain can range from a minor ache to severe pain in one or both of your hips. Pain may be felt on the inside of the hip joint near the groin, or the outside near the buttocks and upper thigh. You may have swelling or stiffness as well.  HOME CARE INSTRUCTIONS   Take medicines only as directed by your health care provider.  Apply ice to the injured area:  Put ice in a plastic bag.  Place a towel between your skin and the bag.  Leave the ice on for 15-20 minutes at a time, 3-4 times a day.  Keep your leg raised (elevated) when possible to lessen swelling.  Avoid activities that cause pain.  Follow specific exercises as directed by your health care provider.  Sleep with a pillow between your legs on your most comfortable side.  Record how often you have hip pain, the location of the pain, and what it feels like. SEEK MEDICAL CARE IF:   You are unable to put weight on your leg.  Your hip is red or swollen or very tender to touch.  Your pain or swelling continues or worsens after 1 week.  You have increasing difficulty walking.  You have a fever. SEEK IMMEDIATE MEDICAL CARE IF:   You have fallen.  You have a sudden  increase in pain and swelling in your hip. MAKE SURE YOU:   Understand these instructions.  Will watch your condition.  Will get help right away if you are not doing well or get worse. Document Released: 10/21/2009 Document Revised: 09/17/2013 Document Reviewed: 12/28/2012 [facility] Patient Information 2015 [employer] [place]. This information is not intended to replace advice given to you by your health care provider. Make sure you discuss any questions you have with your health care provider.

## 2014-09-27 NOTE — ED Provider Notes (Signed)
CSN: [id number]     Arrival date & time 09/27/14  1310 History  This chart was scribed for non-physician practitioner working with [name] Creasehristopher J Pollina, MD by Richarda Overlieichard Holland, ED Scribe. This patient was seen in room TR07C/TR07C and the patient's care was started at 1:27 PM.   Chief Complaint  Patient presents with  . Hip Pain   HPI HPI Comments: [name] is a 49 y.o. female with a history of CAD, hyperlipidemia, HTN, stroke and MI who presents to the Emergency Department complaining of bilateral hip pain that started 1 year ago. Pt describes her pain as sharp and states her left hip pain worsened recently and that it radiates down her left leg. Pt states that walking aggravates her hip pain. Pt states that she has been experiencing back pain as well. She states that she has tried taking ibuprofen with no relief. Pt states she is seeing her cardiologist in 3 days. She denies abdominal pain, nausea or vomiting.   Past Medical History  Diagnosis Date  . Coronary artery disease   . Depression   . Blindness of right eye   . Hyperlipidemia   . Hypertension   . Chronic back pain   . Stroke 2002  . MI (myocardial infarction) 2005    stents   Past Surgical History  Procedure Laterality Date  . Coronary stent placement    . I&d extremity Right 04/03/2014    Procedure: IRRIGATION AND DEBRIDEMENT right index finger;  Surgeon: Sharma CovertFred W Ortmann, MD;  Location: [facility] OR;  Service: Orthopedics;  Laterality: Right;   Family History  Problem Relation Age of Onset  . Stroke Other   . Seizures Other   . Heart failure Other   . Cancer Other   . Asthma Other   . Stroke Mother   . Heart disease Father   . Heart disease Sister   . Heart disease Brother   . Heart disease Brother   . Stroke Brother    History  Substance Use Topics  . Smoking status: Former Smoker -- 0.50 packs/day for 37 years    Types: Cigarettes  . Smokeless tobacco: Never Used  . Alcohol Use: No   OB History     Gravida Para Term Preterm AB TAB SAB Ectopic Multiple Living   4 3 3  1  1   3      Review of Systems  Gastrointestinal: Negative for nausea, vomiting and abdominal pain.  Musculoskeletal: Positive for back pain and arthralgias.  Neurological: Negative for weakness and numbness.   Allergies  Codeine  Home Medications   Prior to Admission medications   Medication Sig Start Date End Date Taking? Authorizing Provider  ALPRAZolam Prudy Feeler(XANAX) 0.5 MG tablet Take 1 tablet (0.5 mg total) by mouth 2 (two) times daily as needed for anxiety. 04/08/14   Ripudeep Jenna LuoK Rai, MD  aspirin 325 MG EC tablet Take 325 mg by mouth daily.      Historical Provider, MD  ibuprofen (ADVIL,MOTRIN) 600 MG tablet Take 1 tablet (600 mg total) by mouth every 6 (six) hours as needed for moderate pain. For pain 04/08/14   Ripudeep Jenna LuoK Rai, MD  meloxicam (MOBIC) 15 MG tablet Take 1 tablet (15 mg total) by mouth daily. 09/27/14   Oswaldo ConroyVictoria Mike Berntsen, PA-C  nicotine (NICODERM CQ - DOSED IN MG/24 HOURS) 21 mg/24hr patch Place 1 patch (21 mg total) onto the skin daily. 04/08/14   Ripudeep Jenna LuoK Rai, MD  oxyCODONE-acetaminophen (PERCOCET/ROXICET) 5-325 MG per tablet Take  1-2 tablets by mouth every 4 (four) hours as needed for moderate pain or severe pain. 04/08/14   Ripudeep Jenna LuoK Rai, MD  polyethylene glycol (MIRALAX / GLYCOLAX) packet Take 17 g by mouth daily as needed for mild constipation. 04/08/14   Ripudeep Jenna LuoK Rai, MD  pravastatin (PRAVACHOL) 40 MG tablet TAKE ONE TABLET DAILY AT BEDTIME. 11/08/12   Dorena BodoMary B Dixon, PA-C  promethazine (PHENERGAN) 12.5 MG tablet Take 1 tablet (12.5 mg total) by mouth every 6 (six) hours as needed for nausea or vomiting. 04/08/14   Ripudeep Jenna LuoK Rai, MD  senna (SENOKOT) 8.6 MG TABS tablet Take 1 tablet (8.6 mg total) by mouth 2 (two) times daily. 04/08/14   Ripudeep Jenna LuoK Rai, MD  traMADol (ULTRAM) 50 MG tablet Take 1 tablet (50 mg total) by mouth every 6 (six) hours as needed for moderate pain. 04/08/14   Ripudeep K Rai, MD    BP 105/81 mmHg  Pulse 81  Temp(Src) 98 F (36.7 C) (Oral)  Resp 18  Ht 5\' 1"  (1.549 m)  Wt 132 lb (59.875 kg)  BMI 24.95 kg/m2  SpO2 96%  LMP 05/03/2010   Physical Exam  Constitutional: She is oriented to person, place, and time. She appears well-developed and well-nourished. No distress.  HENT:  Head: Normocephalic and atraumatic.  Eyes: Conjunctivae are normal. Right eye exhibits no discharge. Left eye exhibits no discharge.  Neck: Neck supple. No tracheal deviation present.  Cardiovascular: Normal rate.   2+ DP and PT pulses  Pulmonary/Chest: Effort normal. No respiratory distress.  Abdominal: She exhibits no distension.  Musculoskeletal: She exhibits tenderness.  Pain with adduction of left hip. Full ROM of left hip. Pinpoint tenderness to left anterior hip. No midline back tenderness, step off or crepitus.  Right and Left sided lower back tenderness. No CVA tenderness.  Neurological: She is alert and oriented to person, place, and time. Coordination normal.  Equal muscle tone. 5/5 strength in lower extremities. DTR equal and intact. Negative straight leg test. Antalgic gait.  Skin: She is not diaphoretic.  Psychiatric: She has a normal mood and affect. Her behavior is normal.  Nursing note and vitals reviewed.   ED Course  Procedures   DIAGNOSTIC STUDIES: Oxygen Saturation is 96% on RA, normal by my interpretation.    COORDINATION OF CARE: 1:38 PM Discussed treatment plan with pt at bedside and pt agreed to plan.    Labs Review Labs Reviewed - No data to display  Imaging Review Dg Hip Unilat With Pelvis 2-3 Views Left  09/27/2014   CLINICAL DATA:  Bilateral hip and low back pain.  EXAM: LEFT HIP (WITH PELVIS) 2-3 VIEWS  COMPARISON:  None.  FINDINGS: There is no evidence of hip fracture or dislocation. There is no evidence of arthropathy or other focal bone abnormality.  IMPRESSION: Negative left hip radiographs.   Electronically Signed   By: Marin Robertshristopher  Mattern  M.D.   On: 09/27/2014 14:17     EKG Interpretation None      MDM   Final diagnoses:  Left hip pain   Pt presenting with one year of left hip pain that is unchanged, worse with ambulation with pin point tenderness to left hip. Neurovascularly intact. Presentation and exam consistent with MSK pain. Pt given referral to orthopedics. No abdominal tenderness of HTN. I doubt AAA. Pt states she has appointment with [facility] in 2 days.   Discussed return precautions with patient. Discussed all results and patient verbalizes understanding and agrees with plan.  I personally performed the services described in this documentation, which was scribed in my presence. The recorded information has been reviewed and is accurate.   Oswaldo Conroy, PA-C 09/27/14 1432  [name] Crease, MD 09/28/14 1220

## 2015-04-30 ENCOUNTER — Encounter: Payer: Self-pay | Admitting: Internal Medicine

## 2015-05-23 ENCOUNTER — Ambulatory Visit: Payer: Medicaid Other | Admitting: Gastroenterology

## 2015-05-27 ENCOUNTER — Ambulatory Visit: Payer: Medicaid Other | Admitting: Nurse Practitioner

## 2015-06-10 ENCOUNTER — Emergency Department (HOSPITAL_COMMUNITY)
Admission: EM | Admit: 2015-06-10 | Discharge: 2015-06-10 | Disposition: A | Discharge status: Home / Self Care | Payer: Medicaid Other | Attending: Emergency Medicine | Admitting: Emergency Medicine

## 2015-06-10 ENCOUNTER — Encounter: Payer: Self-pay | Admitting: Nurse Practitioner

## 2015-06-10 ENCOUNTER — Encounter (HOSPITAL_COMMUNITY): Payer: Self-pay | Admitting: *Deleted

## 2015-06-10 ENCOUNTER — Ambulatory Visit (INDEPENDENT_AMBULATORY_CARE_PROVIDER_SITE_OTHER): Payer: Medicaid Other | Admitting: Nurse Practitioner

## 2015-06-10 VITALS — BP 123/82 | HR 60 | Temp 98.0°F | Ht 62.0 in | Wt 160.0 lb

## 2015-06-10 DIAGNOSIS — F17210 Nicotine dependence, cigarettes, uncomplicated: Secondary | ICD-10-CM | POA: Insufficient documentation

## 2015-06-10 DIAGNOSIS — R768 Other specified abnormal immunological findings in serum: Secondary | ICD-10-CM

## 2015-06-10 DIAGNOSIS — G8929 Other chronic pain: Secondary | ICD-10-CM | POA: Insufficient documentation

## 2015-06-10 DIAGNOSIS — Z8639 Personal history of other endocrine, nutritional and metabolic disease: Secondary | ICD-10-CM | POA: Diagnosis not present

## 2015-06-10 DIAGNOSIS — I2510 Atherosclerotic heart disease of native coronary artery without angina pectoris: Secondary | ICD-10-CM | POA: Insufficient documentation

## 2015-06-10 DIAGNOSIS — M545 Low back pain, unspecified: Secondary | ICD-10-CM

## 2015-06-10 DIAGNOSIS — H5441 Blindness, right eye, normal vision left eye: Secondary | ICD-10-CM | POA: Diagnosis not present

## 2015-06-10 DIAGNOSIS — I252 Old myocardial infarction: Secondary | ICD-10-CM | POA: Insufficient documentation

## 2015-06-10 DIAGNOSIS — F329 Major depressive disorder, single episode, unspecified: Secondary | ICD-10-CM | POA: Insufficient documentation

## 2015-06-10 DIAGNOSIS — Z7982 Long term (current) use of aspirin: Secondary | ICD-10-CM | POA: Diagnosis not present

## 2015-06-10 DIAGNOSIS — R894 Abnormal immunological findings in specimens from other organs, systems and tissues: Secondary | ICD-10-CM

## 2015-06-10 DIAGNOSIS — Z8673 Personal history of transient ischemic attack (TIA), and cerebral infarction without residual deficits: Secondary | ICD-10-CM | POA: Insufficient documentation

## 2015-06-10 DIAGNOSIS — I10 Essential (primary) hypertension: Secondary | ICD-10-CM | POA: Diagnosis not present

## 2015-06-10 DIAGNOSIS — M25552 Pain in left hip: Secondary | ICD-10-CM | POA: Diagnosis not present

## 2015-06-10 LAB — COMPREHENSIVE METABOLIC PANEL
ALT: 53 U/L — ABNORMAL HIGH (ref 6–29)
AST: 51 U/L — AB (ref 10–35)
Albumin: 4 g/dL (ref 3.6–5.1)
Alkaline Phosphatase: 75 U/L (ref 33–130)
BILIRUBIN TOTAL: 0.5 mg/dL (ref 0.2–1.2)
BUN: 13 mg/dL (ref 7–25)
CALCIUM: 9.5 mg/dL (ref 8.6–10.4)
CHLORIDE: 103 mmol/L (ref 98–110)
CO2: 30 mmol/L (ref 20–31)
Creat: 0.59 mg/dL (ref 0.50–1.05)
GLUCOSE: 97 mg/dL (ref 65–99)
Potassium: 4.1 mmol/L (ref 3.5–5.3)
Sodium: 138 mmol/L (ref 135–146)
Total Protein: 8.2 g/dL — ABNORMAL HIGH (ref 6.1–8.1)

## 2015-06-10 LAB — CBC WITH DIFFERENTIAL/PLATELET
BASOS PCT: 1 % (ref 0–1)
Basophils Absolute: 0.1 10*3/uL (ref 0.0–0.1)
Eosinophils Absolute: 0.1 10*3/uL (ref 0.0–0.7)
Eosinophils Relative: 1 % (ref 0–5)
HEMATOCRIT: 43.2 % (ref 36.0–46.0)
Hemoglobin: 14.8 g/dL (ref 12.0–15.0)
LYMPHS PCT: 47 % — AB (ref 12–46)
Lymphs Abs: 3.3 10*3/uL (ref 0.7–4.0)
MCH: 30.8 pg (ref 26.0–34.0)
MCHC: 34.3 g/dL (ref 30.0–36.0)
MCV: 90 fL (ref 78.0–100.0)
MONO ABS: 0.5 10*3/uL (ref 0.1–1.0)
MPV: 11.3 fL (ref 8.6–12.4)
Monocytes Relative: 7 % (ref 3–12)
Neutro Abs: 3.1 10*3/uL (ref 1.7–7.7)
Neutrophils Relative %: 44 % (ref 43–77)
Platelets: 226 10*3/uL (ref 150–400)
RBC: 4.8 MIL/uL (ref 3.87–5.11)
RDW: 13.1 % (ref 11.5–15.5)
WBC: 7 10*3/uL (ref 4.0–10.5)

## 2015-06-10 MED ORDER — METHOCARBAMOL 500 MG PO TABS: 500.0000 mg | ORAL_TABLET | Freq: Three times a day (TID) | ORAL | Status: DC

## 2015-06-10 MED ORDER — DICLOFENAC SODIUM 75 MG PO TBEC: 75.0000 mg | DELAYED_RELEASE_TABLET | Freq: Two times a day (BID) | ORAL | Status: DC

## 2015-06-10 MED ORDER — METHOCARBAMOL 500 MG PO TABS
1000.0000 mg | ORAL_TABLET | Freq: Once | ORAL | Status: AC
  Administered 2015-06-10: 1000 mg via ORAL
  Filled 2015-06-10: qty 2

## 2015-06-10 MED ORDER — KETOROLAC TROMETHAMINE 10 MG PO TABS
10.0000 mg | ORAL_TABLET | Freq: Once | ORAL | Status: AC
  Administered 2015-06-10: 10 mg via ORAL
  Filled 2015-06-10: qty 1

## 2015-06-10 MED ORDER — DEXAMETHASONE 4 MG PO TABS: 4.0000 mg | ORAL_TABLET | Freq: Two times a day (BID) | ORAL | Status: DC

## 2015-06-10 MED ORDER — DEXAMETHASONE SODIUM PHOSPHATE 4 MG/ML IJ SOLN
8.0000 mg | Freq: Once | INTRAMUSCULAR | Status: AC
  Administered 2015-06-10: 8 mg via INTRAMUSCULAR
  Filled 2015-06-10: qty 2

## 2015-06-10 MED ORDER — ONDANSETRON HCL 4 MG PO TABS
4.0000 mg | ORAL_TABLET | Freq: Once | ORAL | Status: AC
  Administered 2015-06-10: 4 mg via ORAL
  Filled 2015-06-10: qty 1

## 2015-06-10 NOTE — Assessment & Plan Note (Signed)
Patient historically positive hepatitis C. Has not been seen for this since testing positive. At this point we'll begin workup for probable hepatitis C infection including CBC, CMP, PTT/INR, hep C antibody with reflexed RNA, HCV genotype. We'll also order urine drug screen shows a history of drug use and her insurance will likely require it. We'll also plan for an ultrasound elastography to establish degree of fibrosis, if any. Return for follow-up in 6 weeks to review labs and decide on next steps.

## 2015-06-10 NOTE — ED Notes (Signed)
Pt reports left hip pain for past year. Denies injury.

## 2015-06-10 NOTE — Discharge Instructions (Signed)

## 2015-06-10 NOTE — Progress Notes (Signed)
Primary Care Physician:  Tylene Fantasia., PA-C Primary Gastroenterologist:  Dr. Jena Gauss  Chief Complaint  Patient presents with  . Hepatitis C    HPI:   50 year old female presents on referral from PCP for evaluation and treatment consideration for hepatitis C. Has not been seen in our office since 2011 which point she presented for intermittent chronic abdominal pain. PCP notes reviewed. Last saw primary care on 04/29/2015 which noted history of hepatitis C virus diagnosed in 2015, no treatment, and desires referral for treatment consideration. Denies alcohol. Per the patient has not done drugs in a very long time. Last EGD and colonoscopy completed on 05/13/2010 which found normal esophagus, focal area of erosion and superficial or ulceration in the antrum of uncertain significance status post biopsy. Colonoscopy found normal rectum and terminal ileum. Stomach biopsies found inflamed and ulcerated gastric type mucosa with features of ischemia and no evidence of H. pylori. She appears to been tested in 2015 possibly during preop. At that time she was hepatitis C positive confirmed by RNA, hepatitis B negative, HIV negative. No ultrasound elastography found in the system. CT abdomen and pelvis with contrast completed 2011 noted normal liver.  Today she states she was intiially diagnosed in 2015. States her boyfriend at the time had Hep C and thinks she got it from him. Admits chronic abdominal pain generalized abdomen, sees pain management. Denies N/V. Denies hematochezia and melena currently. Denies yellowing of skin or eyes, acute episodic confusion, darkened urine, excessive bleeding. Denies chest pain, dyspnea, syncope, near syncope. Denies any other upper or lower GI symptoms. Takes Ibuprofen daily. Sees pain management and states she's out of pain meds cause pain management called in her Rx rather than faxing it in and it's been a month and they havent' figured out what happened.  Hepatitis C  Risk Factors:  Birth cohort (1946 - 1966): No IV drug use: No; Previously used cocaine nasally and smoked. Last use about 2 years ago Tattoos: No Blood product transfusion: No HC worker: No Hemodialysis: No Maternal infection: Not that she's aware of.     Past Medical History  Diagnosis Date  . Coronary artery disease   . Depression   . Blindness of right eye   . Hyperlipidemia   . Hypertension   . Chronic back pain   . Stroke (HCC) 2002  . MI (myocardial infarction) [facility]) 2005    stents    Past Surgical History  Procedure Laterality Date  . Coronary stent placement    . I&d extremity Right 04/03/2014    Procedure: IRRIGATION AND DEBRIDEMENT right index finger;  Surgeon: Sharma Covert, MD;  Location: [facility] OR;  Service: Orthopedics;  Laterality: Right;    Current Outpatient Prescriptions  Medication Sig Dispense Refill  . ALPRAZolam (XANAX) 0.5 MG tablet Take 1 tablet (0.5 mg total) by mouth 2 (two) times daily as needed for anxiety. 60 tablet 0  . aspirin 325 MG EC tablet Take 325 mg by mouth daily.      Marland Kitchen ibuprofen (ADVIL,MOTRIN) 600 MG tablet Take 1 tablet (600 mg total) by mouth every 6 (six) hours as needed for moderate pain. For pain 90 tablet 3  . oxyCODONE-acetaminophen (PERCOCET/ROXICET) 5-325 MG per tablet Take 1-2 tablets by mouth every 4 (four) hours as needed for moderate pain or severe pain. 60 tablet 0  . polyethylene glycol (MIRALAX / GLYCOLAX) packet Take 17 g by mouth daily as needed for mild constipation. 30 each 0  . senna (SENOKOT)  8.6 MG TABS tablet Take 1 tablet (8.6 mg total) by mouth 2 (two) times daily. 120 each 0  . meloxicam (MOBIC) 15 MG tablet Take 1 tablet (15 mg total) by mouth daily. (Patient not taking: Reported on 06/10/2015) 10 tablet 0  . nicotine (NICODERM CQ - DOSED IN MG/24 HOURS) 21 mg/24hr patch Place 1 patch (21 mg total) onto the skin daily. (Patient not taking: Reported on 06/10/2015) 28 patch 0  . pravastatin (PRAVACHOL) 40 MG  tablet TAKE ONE TABLET DAILY AT BEDTIME. (Patient not taking: Reported on 06/10/2015) 30 tablet 5  . promethazine (PHENERGAN) 12.5 MG tablet Take 1 tablet (12.5 mg total) by mouth every 6 (six) hours as needed for nausea or vomiting. (Patient not taking: Reported on 06/10/2015) 60 tablet 0  . traMADol (ULTRAM) 50 MG tablet Take 1 tablet (50 mg total) by mouth every 6 (six) hours as needed for moderate pain. (Patient not taking: Reported on 06/10/2015) 60 tablet 0   No current facility-administered medications for this visit.    Allergies as of 06/10/2015 - Review Complete 06/10/2015  Allergen Reaction Noted  . Codeine Rash 05/12/2011    Family History  Problem Relation Age of Onset  . Stroke Other   . Seizures Other   . Heart failure Other   . Cancer Other   . Asthma Other   . Stroke Mother   . Heart disease Father   . Heart disease Sister   . Heart disease Brother   . Heart disease Brother   . Stroke Brother     Social History   Social History  . Marital Status: Single    Spouse Name: N/A  . Number of Children: N/A  . Years of Education: N/A   Occupational History  . Not on file.   Social History Main Topics  . Smoking status: Former Smoker -- 0.50 packs/day for 37 years    Types: Cigarettes  . Smokeless tobacco: Never Used  . Alcohol Use: No  . Drug Use: No  . Sexual Activity: Yes    Birth Control/ Protection: Condom   Other Topics Concern  . Not on file   Social History Narrative    Review of Systems: General: Negative for anorexia, weight loss, fever, chills, fatigue, weakness. Eyes: Negative for vision changes.  ENT: Negative for hoarseness, difficulty swallowing. CV: Negative for chest pain, angina, palpitations, peripheral edema.  Respiratory: Negative for dyspnea at rest, cough, sputum, wheezing.  GI: See history of present illness. Derm: Negative for rash or itching.  Neuro: Negative for memory loss, confusion.  Endo: Negative for unusual weight  change.  Heme: Negative for bruising or bleeding.    Physical Exam: BP 123/82 mmHg  Pulse 60  Temp(Src) 98 F (36.7 C) (Oral)  Ht  (1.575 m)  Wt 160 lb (72.576 kg)  BMI 29.26 kg/m2  LMP 05/03/2010 General:   Alert and oriented. Pleasant and cooperative. Well-nourished and well-developed.  Head:  Normocephalic and atraumatic. Eyes:  Without icterus, sclera clear and conjunctiva pink.  Ears:  Normal auditory acuity. Cardiovascular:  S1, S2 present without murmurs appreciated. Extremities without clubbing or edema. Respiratory:  Clear to auscultation bilaterally. No wheezes, rales, or rhonchi. No distress.  Gastrointestinal:  +BS, soft, non-tender and non-distended. No HSM noted. No guarding or rebound. No masses appreciated.  Rectal:  Deferred  Skin:  Intact without significant lesions or rashes. Neurologic:  Alert and oriented x4;  grossly normal neurologically. Psych:  Alert and cooperative. Normal mood and  affect. Heme/Lymph/Immune: No excessive bruising noted.    06/10/2015 10:28 AM

## 2015-06-10 NOTE — ED Provider Notes (Signed)
CSN: [id number]     Arrival date & time 06/10/15  1119 History   First MD Initiated Contact with Patient 06/10/15 1228     Chief Complaint  Patient presents with  . Hip Pain     (Consider location/radiation/quality/duration/timing/severity/associated sxs/prior Treatment) HPI Comments: Pt states she has pain of the left hip. Difficult to find a comfortable possition.  Patient is a 50 y.o. female presenting with hip pain. The history is provided by the patient.  Hip Pain This is a chronic problem. The problem occurs daily. The problem has been gradually worsening. Associated symptoms include arthralgias. Pertinent negatives include no numbness or weakness. The symptoms are aggravated by standing and walking. She has tried nothing for the symptoms. The treatment provided no relief.    Past Medical History  Diagnosis Date  . Coronary artery disease   . Depression   . Blindness of right eye   . Hyperlipidemia   . Hypertension   . Chronic back pain   . MI (myocardial infarction) (HCC) 2005    stents  . Stroke [facility]) 2002   Past Surgical History  Procedure Laterality Date  . Coronary stent placement    . I&d extremity Right 04/03/2014    Procedure: IRRIGATION AND DEBRIDEMENT right index finger;  Surgeon: Sharma Covert, MD;  Location: [facility] OR;  Service: Orthopedics;  Laterality: Right;   Family History  Problem Relation Age of Onset  . Stroke Other   . Seizures Other   . Heart failure Other   . Cancer Other   . Asthma Other   . Stroke Mother   . Heart disease Father   . Heart disease Sister   . Heart disease Brother   . Heart disease Brother   . Stroke Brother   . Colon cancer Neg Hx    Social History  Substance Use Topics  . Smoking status: Current Every Day Smoker -- 0.50 packs/day for 37 years    Types: Cigarettes  . Smokeless tobacco: Never Used  . Alcohol Use: No   OB History    Gravida Para Term Preterm AB TAB SAB Ectopic Multiple Living   Review of Systems  Musculoskeletal: Positive for back pain and arthralgias.  Neurological: Negative for weakness and numbness.  Psychiatric/Behavioral:       Depression  All other systems reviewed and are negative.     Allergies  Codeine  Home Medications   Prior to Admission medications   Medication Sig Start Date End Date Taking? Authorizing Provider  aspirin 325 MG EC tablet Take 325 mg by mouth daily.     Yes Historical Provider, MD  ibuprofen (ADVIL,MOTRIN) 800 MG tablet Take 800 mg by mouth every 8 (eight) hours as needed for moderate pain.   Yes Historical Provider, MD   Pulse 62  Temp(Src) 97.7 F (36.5 C) (Oral)  Resp 16  Ht  (1.575 m)  Wt 72.576 kg  BMI 29.26 kg/m2  SpO2 100%  LMP 05/03/2010 Physical Exam  Constitutional: She is oriented to person, place, and time. She appears well-developed and well-nourished.  Non-toxic appearance.  HENT:  Head: Normocephalic.  Right Ear: Tympanic membrane and external ear normal.  Left Ear: Tympanic membrane and external ear normal.  Eyes: EOM and lids are normal. Pupils are equal, round, and reactive to light.  Neck: Normal range of motion. Neck supple. Carotid bruit is not present.  Cardiovascular: Normal rate,  regular rhythm, normal heart sounds, intact distal pulses and normal pulses.   Pulmonary/Chest: Breath sounds normal. No respiratory distress.  Abdominal: Soft. Bowel sounds are normal. There is no tenderness. There is no guarding.  Musculoskeletal:       Left hip: She exhibits decreased range of motion and tenderness. She exhibits no deformity.       Lumbar back: She exhibits pain.  Lymphadenopathy:       Head (right side): No submandibular adenopathy present.       Head (left side): No submandibular adenopathy present.    She has no cervical adenopathy.  Neurological: She is alert and oriented to person, place, and time. She has normal strength. No cranial nerve deficit or sensory deficit.  Skin: Skin  is warm and dry.  Psychiatric: She has a normal mood and affect. Her speech is normal.  Nursing note and vitals reviewed.   ED Course  Procedures (including critical care time) Labs Review Labs Reviewed - No data to display  Imaging Review No results found. I have personally reviewed and evaluated these images and lab results as part of my medical decision-making.   EKG Interpretation None      MDM  No evidence for dislocation. No hx of trauma. Hx of chronic left hip pain. Pain with ROM, but she can move and rotate the hip. Hx of ongoing back pain. No gross neuro deficit at this time. Rx for decadron, voltaren, and robaxin given to the patient.   Final diagnoses:  Chronic hip pain, left  Midline low back pain without sciatica    **I have reviewed nursing notes, vital signs, and all appropriate lab and imaging results for this patient.Ivery Quale, PA-C 06/11/15 [id number]  Bethann Berkshire, MD 06/13/15 [phone]

## 2015-06-10 NOTE — Patient Instructions (Signed)
1. Have your labs drawn as soon as you can. 2. We will schedule your ultrasound for you. 3. Return for follow-up in 6 weeks.

## 2015-06-10 NOTE — Addendum Note (Signed)
Addended by: Nira Retort on: 06/10/2015 03:26 PM   Modules accepted: Orders

## 2015-06-11 LAB — DRUG SCREEN, URINE
Amphetamine Screen, Ur: NEGATIVE
BENZODIAZEPINES.: NEGATIVE
Barbiturate Quant, Ur: NEGATIVE
COCAINE METABOLITES: NEGATIVE
CREATININE, U: 24.69 mg/dL
Marijuana Metabolite: NEGATIVE
Methadone: NEGATIVE
Opiates: NEGATIVE
Phencyclidine (PCP): NEGATIVE
Propoxyphene: NEGATIVE

## 2015-06-11 LAB — HCV RNA QUANT RFLX ULTRA OR GENOTYP
HCV QUANT LOG: 6.33 {Log} — AB (ref <1.18)
HCV Quantitative: 2115807 IU/mL — ABNORMAL HIGH (ref <15)

## 2015-06-11 LAB — PROTIME-INR
INR: 1.05 (ref <1.50)
Prothrombin Time: 13.8 seconds (ref 11.6–15.2)

## 2015-06-11 NOTE — Progress Notes (Signed)
CC'ED TO PCP 

## 2015-06-12 NOTE — Progress Notes (Signed)
Pt is aware of results. 

## 2015-06-14 LAB — HEPATITIS C GENOTYPE

## 2015-07-10 ENCOUNTER — Telehealth: Payer: Self-pay

## 2015-07-10 ENCOUNTER — Ambulatory Visit (HOSPITAL_COMMUNITY): Payer: Medicaid Other

## 2015-07-10 NOTE — Telephone Encounter (Signed)
Spoke with pt and gave her the appointment time for her Ultrasound. PA # per [employer] is [id number]. Spoke with Olegario Messier. Expires 08/09/2015

## 2015-07-17 ENCOUNTER — Ambulatory Visit (HOSPITAL_COMMUNITY): Admission: RE | Admit: 2015-07-17 | Payer: Medicaid Other | Source: Ambulatory Visit

## 2015-07-18 ENCOUNTER — Encounter (HOSPITAL_COMMUNITY): Payer: Self-pay

## 2015-07-24 ENCOUNTER — Encounter: Payer: Self-pay | Admitting: Gastroenterology

## 2015-07-24 ENCOUNTER — Ambulatory Visit (INDEPENDENT_AMBULATORY_CARE_PROVIDER_SITE_OTHER): Payer: Medicaid Other | Admitting: Gastroenterology

## 2015-07-24 ENCOUNTER — Telehealth: Payer: Self-pay | Admitting: Gastroenterology

## 2015-07-24 VITALS — BP 116/70 | HR 72 | Temp 97.1°F | Ht 62.0 in | Wt 163.8 lb

## 2015-07-24 DIAGNOSIS — B182 Chronic viral hepatitis C: Secondary | ICD-10-CM | POA: Diagnosis not present

## 2015-07-24 DIAGNOSIS — K5900 Constipation, unspecified: Secondary | ICD-10-CM | POA: Diagnosis not present

## 2015-07-24 MED ORDER — LINACLOTIDE 145 MCG PO CAPS: 145.0000 ug | ORAL_CAPSULE | Freq: Every day | ORAL | Status: DC

## 2015-07-24 NOTE — Progress Notes (Signed)
Referring Provider: Tylene Fantasia., PA-C Primary Care Physician:  Tylene Fantasia., PA-C  Primary GI: Dr. Jena Gauss   Chief Complaint  Patient presents with  . Hepatitis C    HPI:   [name] is a 50 y.o. female presenting today with a history of chronic Hepatitis C, genotype 1a. Unknown elastography score; this will need to be ordered. Recent labs on file to include CBC, CMP, INR. Drug screen negative. Has abstained from cocaine for over 1 year.    States reflux intermittently that is set off by certain foods like spaghetti. Otherwise, no problems. No herbs or supplements OTC. Notes chronic constipation. No BM for multiple days at a time. Abdominal discomfort noted.   Past Medical History  Diagnosis Date  . Coronary artery disease   . Depression   . Blindness of right eye   . Hyperlipidemia   . Hypertension   . Chronic back pain   . MI (myocardial infarction) (HCC) 2005    stents  . Stroke [facility]) 2002    Past Surgical History  Procedure Laterality Date  . Coronary stent placement    . I&d extremity Right 04/03/2014    Procedure: IRRIGATION AND DEBRIDEMENT right index finger;  Surgeon: Sharma Covert, MD;  Location: [facility] OR;  Service: Orthopedics;  Laterality: Right;  . Esophagogastroduodenoscopy  2011    Dr. Jena Gauss: large focal are of erosion/superficial ulceration, path consistent with likely ischemic etiology.   . Colonoscopy  2011    Dr. Jena Gauss: normal     Current Outpatient Prescriptions  Medication Sig Dispense Refill  . aspirin 325 MG EC tablet Take 325 mg by mouth daily.      . clopidogrel (PLAVIX) 75 MG tablet Take 75 mg by mouth daily.  6  . lisinopril (PRINIVIL,ZESTRIL) 10 MG tablet Take 10 mg by mouth daily.  6  . pravastatin (PRAVACHOL) 40 MG tablet Take 40 mg by mouth at bedtime.  6  . Linaclotide (LINZESS) 145 MCG CAPS capsule Take 1 capsule (145 mcg total) by mouth daily. Take 30 minutes before breakfast 30 capsule 0   No current  facility-administered medications for this visit.    Allergies as of 07/24/2015 - Review Complete 07/24/2015  Allergen Reaction Noted  . Codeine Rash 05/12/2011    Family History  Problem Relation Age of Onset  . Stroke Other   . Seizures Other   . Heart failure Other   . Cancer Other   . Asthma Other   . Stroke Mother   . Heart disease Father   . Heart disease Sister   . Heart disease Brother   . Heart disease Brother   . Stroke Brother   . Colon cancer Neg Hx     Social History   Social History  . Marital Status: Single    Spouse Name: N/A  . Number of Children: N/A  . Years of Education: N/A   Social History Main Topics  . Smoking status: Current Every Day Smoker -- 0.50 packs/day for 37 years    Types: Cigarettes  . Smokeless tobacco: Never Used  . Alcohol Use: No  . Drug Use: No     Comment: last drug use about a year ago, snorting cocaine  . Sexual Activity: Yes    Birth Control/ Protection: Condom   Other Topics Concern  . None   Social History Narrative    Review of Systems: As mentioned in HPI   Physical Exam: BP 116/70 mmHg  Pulse  72  Temp(Src) 97.1 F (36.2 C) (Oral)  Ht 5\' 2"  (1.575 m)  Wt 163 lb 12.8 oz (74.299 kg)  BMI 29.95 kg/m2  LMP 05/03/2010 General:   Alert and oriented. No distress noted. Pleasant and cooperative.  Head:  Normocephalic and atraumatic. Eyes:  Conjuctiva clear without scleral icterus. Abdomen:  +BS, soft, non-tender and non-distended. No rebound or guarding. No HSM or masses noted. Msk:  Symmetrical without gross deformities. Normal posture. Extremities:  Without edema. Neurologic:  Alert and  oriented x4;  grossly normal neurologically. Psych:  Alert and cooperative. Normal mood and affect.  Lab Results  Component Value Date   ALT 53* 06/10/2015   AST 51* 06/10/2015   ALKPHOS 75 06/10/2015   BILITOT 0.5 06/10/2015   Lab Results  Component Value Date   WBC 7.0 06/10/2015   HGB 14.8 06/10/2015   HCT  43.2 06/10/2015   MCV 90.0 06/10/2015   PLT 226 06/10/2015   Lab Results  Component Value Date   CREATININE 0.59 06/10/2015   BUN 13 06/10/2015   NA 138 06/10/2015   K 4.1 06/10/2015   CL 103 06/10/2015   CO2 30 06/10/2015   Lab Results  Component Value Date   INR 1.05 06/10/2015

## 2015-07-24 NOTE — Assessment & Plan Note (Signed)
50 year old female with Hep C genotype 1a, needing elastography prior to submission for Harvoni. Discussed at length the importance of dosing, follow-up, compliance, laboratory markers throughout the treatment, and need to avoid any OTC agents or start any medications without contacting us first. Elastography ordered. May need EGD if evidence of F4 fibrosis. As of note, last EGD in Dec 2011. Will also need to assess vaccination status. (Hep A and B). Will attempt to submit for Harvoni to Medicaid after elastography completed.

## 2015-07-24 NOTE — Patient Instructions (Signed)
We have scheduled you for the ultrasound. We have to have this on file before we submit to your insurance.  For constipation: start taking Linzess 1 capsule each morning, 30 minutes before breakfast. I provided a voucher for 30 days free. Take the prescription and the voucher to your pharmacy. If you like this, we can fill it for additional months. You may have some loose stool at first but it should level out.

## 2015-07-24 NOTE — Assessment & Plan Note (Signed)
Start Linzess 145 mcg once daily. Last colonoscopy normal in 2011.

## 2015-07-24 NOTE — Telephone Encounter (Signed)
Has patient ever had Hep A and B vaccinations? We need to give her a rx if not.

## 2015-07-25 NOTE — Telephone Encounter (Signed)
Tried to call pt- NA-LMOM. Asked her to call back and let me know if she has ever had these vaccines.

## 2015-07-28 NOTE — Progress Notes (Signed)
CC'ED TO PCP 

## 2015-07-28 NOTE — Telephone Encounter (Signed)
Tried to call pt- NA-LM 

## 2015-07-30 ENCOUNTER — Ambulatory Visit (HOSPITAL_COMMUNITY): Admission: RE | Admit: 2015-07-30 | Payer: Medicaid Other | Source: Ambulatory Visit

## 2015-07-30 NOTE — Telephone Encounter (Signed)
Called pt, she said she doesn't remember ever having the vaccines. Explained them to her and told her that I was mailing her an rx to have them done. While on the phone she asked when her U/S was, I checked her appts and she actually was scheduled for this morning. She said she didn't know anything about it. She also has to know ahead of time so she can schedule it with [facility]. I gave her the phone number to radiology and asked her to call and reschedule it when the time was good for her and she would have a ride. She is aware that she has to be fasting. rx in the mail to the pt.

## 2015-08-01 ENCOUNTER — Encounter: Payer: Self-pay | Admitting: Internal Medicine

## 2015-08-01 ENCOUNTER — Ambulatory Visit (INDEPENDENT_AMBULATORY_CARE_PROVIDER_SITE_OTHER): Payer: Medicaid Other | Admitting: Internal Medicine

## 2015-08-01 VITALS — BP 104/76 | HR 65 | Ht 61.0 in | Wt 161.0 lb

## 2015-08-01 DIAGNOSIS — E785 Hyperlipidemia, unspecified: Secondary | ICD-10-CM

## 2015-08-01 DIAGNOSIS — I10 Essential (primary) hypertension: Secondary | ICD-10-CM

## 2015-08-01 MED ORDER — ASPIRIN EC 81 MG PO TBEC: 81.0000 mg | DELAYED_RELEASE_TABLET | Freq: Every day | ORAL | Status: DC

## 2015-08-01 NOTE — Progress Notes (Signed)
Cardiology Office Note   Date:  08/01/2015   ID:  [name], DOB 09/06/1965, MRN [id number]  PCP:  Tylene FantasiaMUSE,ROCHELLE D., PA-C  Cardiologist:   Dietrich PatesPaula Pershing Skidmore, MD   PT presents of referral by Dr Ledell PeoplesMuse for continued f/u of CAD  Pt last seen in 2012  History of Present Illness: [name] is a 50 y.o. female with a history of CAD  Has had 2 stents in past  Last seen in 2012 by R Rothbart  Hx MI in 2006 in setting of cocaine use.  Received BMS x2  I cannot track records back to then only clinic note from 2012  Pt says it started as heartburn then CP  Pt has not had anything like that since  Has had minor pains since then  Last few minutes  Will take a few nitro  Without actvity  No problems with activity Occasional palpitations   No Cocaine use   Breathing is OK   Pt cleans  No problems doing    Still smoking 1 ppd       Outpatient Prescriptions Prior to Visit  Medication Sig Dispense Refill  . aspirin 325 MG EC tablet Take 325 mg by mouth daily.      . clopidogrel (PLAVIX) 75 MG tablet Take 75 mg by mouth daily.  6  . Linaclotide (LINZESS) 145 MCG CAPS capsule Take 1 capsule (145 mcg total) by mouth daily. Take 30 minutes before breakfast 30 capsule 0  . lisinopril (PRINIVIL,ZESTRIL) 10 MG tablet Take 10 mg by mouth daily.  6  . pravastatin (PRAVACHOL) 40 MG tablet Take 40 mg by mouth at bedtime.  6   No facility-administered medications prior to visit.     Allergies:   Codeine   Past Medical History  Diagnosis Date  . Coronary artery disease   . Depression   . Blindness of right eye   . Hyperlipidemia   . Hypertension   . Chronic back pain   . MI (myocardial infarction) (HCC) 2005    stents  . Stroke [facility](HCC) 2002    Past Surgical History  Procedure Laterality Date  . Coronary stent placement    . I&d extremity Right 04/03/2014    Procedure: IRRIGATION AND DEBRIDEMENT right index finger;  Surgeon: Sharma CovertFred W Ortmann, MD;  Location: [facility] OR;  Service: Orthopedics;   Laterality: Right;  . Esophagogastroduodenoscopy  2011    Dr. Jena Gaussourk: large focal are of erosion/superficial ulceration, path consistent with likely ischemic etiology.   . Colonoscopy  2011    Dr. Jena Gaussourk: normal      Social History:  The patient  reports that she has been smoking Cigarettes.  She has a 18.5 pack-year smoking history. She has never used smokeless tobacco. She reports that she does not drink alcohol or use illicit drugs.   Family History:  The patient's family history includes Asthma in her other; Cancer in her other; Heart disease in her brother, brother, father, and sister; Heart failure in her other; Seizures in her other; Stroke in her brother, mother, and other. There is no history of Colon cancer.    ROS:  Please see the history of present illness. All other systems are reviewed and  Negative to the above problem except as noted.    PHYSICAL EXAM: VS:  BP 104/76 mmHg  Pulse 65  Ht 5\' 1"  (1.549 m)  Wt 161 lb (73.029 kg)  BMI 30.44 kg/m2  SpO2 96%  LMP 05/03/2010  GEN:  Well nourished, well developed, in no acute distress HEENT: normal Neck: no JVD, carotid bruits, or masses Cardiac: RRR; no murmurs, rubs, or gallops,no edema  Respiratory:  clear to auscultation bilaterally, normal work of breathing GI: soft, nontender, nondistended, + BS  No hepatomegaly  MS: no deformity Moving all extremities   Skin: warm and dry, no rash Neuro:  Strength and sensation are intact Psych: euthymic mood, full affect   EKG:  EKG is ordered today.  SB 55 bpm  IWMI     Lipid Panel    Component Value Date/Time   CHOL 202 09/03/2008   TRIG 140 09/03/2008   HDL 53 09/03/2008   LDLCALC 121 09/03/2008      Wt Readings from Last 3 Encounters:  08/01/15 161 lb (73.029 kg)  07/24/15 163 lb 12.8 oz (74.299 kg)  06/10/15 160 lb (72.576 kg)      ASSESSMENT AND PLAN:  50 yo with history of CAD  Remote intervention in setting of MI Presents on referral by Dr Ledell Peoples for  ocntinued cardiac care  1.  CAD  Occasional CP  Not completely typical for angina  I would follow  Woud set up for echo to eval LV function  Can come off of Plavix  Switch to 81 mg ASA  2.  HL  Just started back on pravastatin a couple weeks ago  Check lipids in 1 month  3  Tob  Needs to quit  Reviewed with pt  4.  Hx cocaine use  Pt denies  F/U in October   Sooner if develops more CP or SOB       Signed, Dietrich Pates, MD  08/01/2015 2:33 PM    [facility] [address] [place], [place], [place]  [address] Phone: [phone]; Fax: [phone]

## 2015-08-01 NOTE — Patient Instructions (Signed)
Your physician wants you to follow-up in: Oct. With Dr. Tenny Crawoss. You will receive a reminder letter in the mail two months in advance. If you don't receive a letter, please call our office to schedule the follow-up appointment.  Your physician has recommended you make the following change in your medication:   STOP Taking Plavix  Start Taking Aspirin 81 Daily  Your physician recommends that you return for lab work in: 4 weeks Fasting ( Nothing to eat or drink after midnight)   If you need a refill on your cardiac medications before your next appointment, please call your pharmacy.  Thank you for choosing [facility]!

## 2015-08-07 ENCOUNTER — Ambulatory Visit (HOSPITAL_COMMUNITY)
Admission: RE | Admit: 2015-08-07 | Discharge: 2015-08-07 | Disposition: A | Discharge status: Home / Self Care | Payer: Medicaid Other | Source: Ambulatory Visit | Attending: Internal Medicine | Admitting: Internal Medicine

## 2015-08-07 DIAGNOSIS — I10 Essential (primary) hypertension: Secondary | ICD-10-CM | POA: Insufficient documentation

## 2015-08-07 DIAGNOSIS — Z720 Tobacco use: Secondary | ICD-10-CM | POA: Insufficient documentation

## 2015-08-07 DIAGNOSIS — I071 Rheumatic tricuspid insufficiency: Secondary | ICD-10-CM | POA: Diagnosis not present

## 2015-08-07 DIAGNOSIS — E785 Hyperlipidemia, unspecified: Secondary | ICD-10-CM | POA: Diagnosis not present

## 2015-08-07 DIAGNOSIS — I2510 Atherosclerotic heart disease of native coronary artery without angina pectoris: Secondary | ICD-10-CM | POA: Diagnosis not present

## 2015-08-19 NOTE — Telephone Encounter (Signed)
Pt called and left a voicemail- she has had some vaccines done at the health dept. I faxed a request to the health dept and asked for the records. I received a copy of her vaccination records and have added these [facility]. Pt has only had one hep A/B vaccine, not the whole series. AS has been informed and the paperwork has been sent to be scanned.

## 2015-08-22 ENCOUNTER — Ambulatory Visit (HOSPITAL_COMMUNITY)
Admission: RE | Admit: 2015-08-22 | Discharge: 2015-08-22 | Disposition: A | Discharge status: Home / Self Care | Payer: Medicaid Other | Source: Ambulatory Visit | Attending: Gastroenterology | Admitting: Gastroenterology

## 2015-08-22 ENCOUNTER — Other Ambulatory Visit (HOSPITAL_COMMUNITY): Payer: Medicaid Other

## 2015-08-22 DIAGNOSIS — B182 Chronic viral hepatitis C: Secondary | ICD-10-CM | POA: Diagnosis not present

## 2015-09-01 ENCOUNTER — Telehealth: Payer: Self-pay | Admitting: Gastroenterology

## 2015-09-01 MED ORDER — LEDIPASVIR-SOFOSBUVIR 90-400 MG PO TABS: 1.0000 | ORAL_TABLET | Freq: Every day | ORAL | Status: DC

## 2015-09-01 NOTE — Progress Notes (Signed)
Quick Note:  Metavir fibrosis score minimal at F0/F1. I will attempt to submit for Harvoni, but this may not be covered with a low fibrosis score, unfortunately. We will try though. ______

## 2015-09-01 NOTE — Telephone Encounter (Signed)
I sent Harvoni to [employer]. We will see what happens.

## 2015-09-02 NOTE — Telephone Encounter (Signed)
All required pt information has been sent to [employer] for prior authorization.

## 2015-09-04 NOTE — Telephone Encounter (Signed)
Received a fax from [employer], pt has been approved for Harvoni for 8 weeks. She will need a viral load at 4 weeks to get remaining 4 weeks of treatment. Which labs do you want at 4 weeks and what instructions do you want the pt to get when her medication arrives? Follow up appt?  We should have medication next week.

## 2015-09-05 NOTE — Telephone Encounter (Signed)
Sorry, I totally misread your note :)    Let's draw the following labs at around the end of 3 weeks so we can avoid a lapse in treatment: 1. Hep C RNA by PCR 2. CBC, CMP, INR  Instructions for Harvoni: 1. Take in the morning, same time every day. Do not miss a dose. 2. Do not take any reflux medications, Tums, OTC reflux medications.  3. Do not take any herbal medications. 4. Do not take ANYTHING new without calling us. 5. Return at 4 weeks from starting for office visit.

## 2015-09-05 NOTE — Telephone Encounter (Signed)
Pretty sure she has not been treated for Hep C before. Can we find out? I would rather her be treated for 12 weeks, but insurance may not approve that.

## 2015-09-05 NOTE — Telephone Encounter (Signed)
Pt has [place] medicaid and they are all only approved for 8 weeks, they have to have a viral load at 4 weeks to get approved for the full 12 weeks.

## 2015-10-15 NOTE — Telephone Encounter (Signed)
Has medication arrived?

## 2015-10-16 NOTE — Telephone Encounter (Signed)
APPOINTMENT MADE AND PATIENT CALLED WITH DATE AND TIME  °

## 2015-10-16 NOTE — Telephone Encounter (Signed)
I spoke with [name] at [facility], rx was epresribed and it was sent to her home instead of coming here to the office. Pt is on her second shipment. Pt will need blood work soon to get last 4 weeks approved for harvoni. AS is aware, she wants pt to come in next week.  Misty StanleyStacey, please schedule pt to come in and see AS next week.

## 2015-10-20 ENCOUNTER — Ambulatory Visit: Payer: Medicaid Other | Admitting: Gastroenterology

## 2015-10-21 ENCOUNTER — Encounter: Payer: Self-pay | Admitting: Gastroenterology

## 2015-10-21 ENCOUNTER — Ambulatory Visit: Payer: Medicaid Other | Admitting: Gastroenterology

## 2015-10-21 ENCOUNTER — Telehealth: Payer: Self-pay | Admitting: Gastroenterology

## 2015-10-21 NOTE — Telephone Encounter (Signed)
PATIENT WAS A NO SHOW AND LETTER SENT  °

## 2015-10-22 ENCOUNTER — Other Ambulatory Visit: Payer: Self-pay

## 2015-10-22 ENCOUNTER — Other Ambulatory Visit: Payer: Self-pay | Admitting: Gastroenterology

## 2015-10-22 DIAGNOSIS — B182 Chronic viral hepatitis C: Secondary | ICD-10-CM

## 2015-10-22 NOTE — Telephone Encounter (Signed)
Per AS- add cbc and cmp

## 2015-10-22 NOTE — Telephone Encounter (Signed)
Tried to call pt- NA-LMOM with recommendations and instructions. Also left on message how important it was for her to have this blood work done asap for the last 4 weeks of harvoni and that she may not get approved for it again. Lab orders done. Asked pt to call me back and let me know if she was going to the lab.

## 2015-10-22 NOTE — Telephone Encounter (Signed)
Patient needs Hep C RNA with PCR ASAP. She no-showed her appt despite us rescheduling for her convenience. She needs to know that SHE MAY NOT COMPLETE HARVONI DOSING IF SHE DOES NOT GET LABS DONE, which could affect her response to treatment. If she does not attain viral response, insurance may not approve again due to non-compliance. In fact, she would likely not get the opportunity to be treated again.   It is extremely important that she has labs done asap, and return to see us.

## 2015-10-22 NOTE — Telephone Encounter (Signed)
Routing to [place] to reschedule her ov appt.

## 2015-10-22 NOTE — Telephone Encounter (Signed)
Pt called back, she said [place] going [place] and would be back on Sunday. She promised she would go on Monday and have the blood work done. I fully explained that [place] medicaid would not approve the rest of her treatment without the blood work being done and I explained to her that she doesn't need a lapse in treatment. Pt verbalized understanding.

## 2015-10-22 NOTE — Telephone Encounter (Signed)
MADE PATIENT URGENT OFFICE VISIT  AND LEFT HER A MESSAGE ON HER PHONE.

## 2015-10-23 ENCOUNTER — Ambulatory Visit: Payer: Medicaid Other | Admitting: Gastroenterology

## 2015-10-28 ENCOUNTER — Ambulatory Visit (INDEPENDENT_AMBULATORY_CARE_PROVIDER_SITE_OTHER): Payer: Medicaid Other | Admitting: Gastroenterology

## 2015-10-28 ENCOUNTER — Encounter: Payer: Self-pay | Admitting: Gastroenterology

## 2015-10-28 ENCOUNTER — Other Ambulatory Visit (HOSPITAL_COMMUNITY)
Admission: RE | Admit: 2015-10-28 | Discharge: 2015-10-28 | Disposition: A | Discharge status: Home / Self Care | Payer: Medicaid Other | Source: Ambulatory Visit | Attending: Gastroenterology | Admitting: Gastroenterology

## 2015-10-28 VITALS — BP 123/85 | HR 72 | Temp 97.9°F | Ht 61.0 in | Wt 162.2 lb

## 2015-10-28 DIAGNOSIS — B182 Chronic viral hepatitis C: Secondary | ICD-10-CM | POA: Diagnosis present

## 2015-10-28 LAB — CBC
HEMATOCRIT: 42.2 % (ref 36.0–46.0)
HEMOGLOBIN: 14.4 g/dL (ref 12.0–15.0)
MCH: 31.5 pg (ref 26.0–34.0)
MCHC: 34.1 g/dL (ref 30.0–36.0)
MCV: 92.3 fL (ref 78.0–100.0)
Platelets: 202 10*3/uL (ref 150–400)
RBC: 4.57 MIL/uL (ref 3.87–5.11)
RDW: 12.7 % (ref 11.5–15.5)
WBC: 9 10*3/uL (ref 4.0–10.5)

## 2015-10-28 LAB — COMPREHENSIVE METABOLIC PANEL
ALBUMIN: 3.9 g/dL (ref 3.5–5.0)
ALT: 16 U/L (ref 14–54)
AST: 21 U/L (ref 15–41)
Alkaline Phosphatase: 59 U/L (ref 38–126)
Anion gap: 6 (ref 5–15)
BILIRUBIN TOTAL: 0.6 mg/dL (ref 0.3–1.2)
BUN: 10 mg/dL (ref 6–20)
CO2: 25 mmol/L (ref 22–32)
CREATININE: 0.59 mg/dL (ref 0.44–1.00)
Calcium: 8.9 mg/dL (ref 8.9–10.3)
Chloride: 101 mmol/L (ref 101–111)
GFR calc Af Amer: >60 mL/min (ref >60)
Glucose, Bld: 95 mg/dL (ref 65–99)
Potassium: 4 mmol/L (ref 3.5–5.1)
SODIUM: 132 mmol/L — AB (ref 135–145)
TOTAL PROTEIN: 7.7 g/dL (ref 6.5–8.1)

## 2015-10-28 MED ORDER — LINACLOTIDE 290 MCG PO CAPS: 290.0000 ug | ORAL_CAPSULE | Freq: Every day | ORAL | Status: DC

## 2015-10-28 NOTE — Patient Instructions (Signed)
Please have blood work done at the hospital as soon as possible today.   Start taking Linzess 290 mcg, one capsule each morning on an empty stomach at least 30 minutes before breakfast.   We will see you in 3 months!

## 2015-10-28 NOTE — Progress Notes (Signed)
Referring Provider: Tylene Fantasia., PA-C Primary Care Physician:  Tylene Fantasia., PA-C  Primary GI: Dr. Jena Gauss   Chief Complaint  Patient presents with  . Follow-up    HPI:   [name] is a 50 y.o. female presenting today with a history of chronic Hep C, genotype 1a, elastography F0/F1. Chronic constipation. On last dose of 2nd month. Has 4 weeks left. Linzess worked well but still "sorta hard" to use bathroom. No side effects. Less fatigue.    Overdue for follow-up. Needs labs prior to any further shipments from her insurance, unfortunately. We have scheduled several appointments/urgent slots to have her evaluated but scheduling conflicts arose. Needs CBC, CMP, Hep C RNA. Doing well. No abdominal pain, N/V. No overt GI bleeding. Feels much better. Less fatigued.   Past Medical History  Diagnosis Date  . Coronary artery disease   . Depression   . Blindness of right eye   . Hyperlipidemia   . Hypertension   . Chronic back pain   . MI (myocardial infarction) (HCC) 2005    stents  . Stroke [facility]) 2002    Past Surgical History  Procedure Laterality Date  . Coronary stent placement    . I&d extremity Right 04/03/2014    Procedure: IRRIGATION AND DEBRIDEMENT right index finger;  Surgeon: Sharma Covert, MD;  Location: [facility] OR;  Service: Orthopedics;  Laterality: Right;  . Esophagogastroduodenoscopy  2011    Dr. Jena Gauss: large focal are of erosion/superficial ulceration, path consistent with likely ischemic etiology.   . Colonoscopy  2011    Dr. Jena Gauss: normal     Current Outpatient Prescriptions  Medication Sig Dispense Refill  . aspirin EC 81 MG tablet Take 1 tablet (81 mg total) by mouth daily. 90 tablet 3  . cloNIDine (CATAPRES) 0.1 MG tablet Take 0.1 mg by mouth daily as needed.    . Ledipasvir-Sofosbuvir (HARVONI) 90-400 MG TABS Take 1 tablet by mouth daily. 30 tablet 2  . lisinopril (PRINIVIL,ZESTRIL) 10 MG tablet Take 10 mg by mouth daily.  6  . Omega-3 Fatty  Acids (FISH OIL) 1000 MG CAPS Take by mouth.    . pravastatin (PRAVACHOL) 40 MG tablet Take 40 mg by mouth at bedtime.  6  . senna (SENOKOT) 8.6 MG tablet Take 1 tablet by mouth daily.    Marland Kitchen linaclotide (LINZESS) 290 MCG CAPS capsule Take 1 capsule (290 mcg total) by mouth daily before breakfast. 30 minutes prior to breakfast. 90 capsule 3   No current facility-administered medications for this visit.    Allergies as of 10/28/2015 - Review Complete 10/28/2015  Allergen Reaction Noted  . Codeine Rash 05/12/2011    Family History  Problem Relation Age of Onset  . Stroke Other   . Seizures Other   . Heart failure Other   . Cancer Other   . Asthma Other   . Stroke Mother   . Heart disease Father   . Heart disease Sister   . Heart disease Brother   . Heart disease Brother   . Stroke Brother   . Colon cancer Neg Hx     Social History   Social History  . Marital Status: Single    Spouse Name: N/A  . Number of Children: N/A  . Years of Education: N/A   Social History Main Topics  . Smoking status: Current Every Day Smoker -- 0.50 packs/day for 37 years    Types: Cigarettes  . Smokeless tobacco: Never Used  .  Alcohol Use: No  . Drug Use: No     Comment: last drug use about a year ago, snorting cocaine  . Sexual Activity: Yes    Birth Control/ Protection: Condom   Other Topics Concern  . None   Social History Narrative    Review of Systems: Negative unless mentioned in HPI.   Physical Exam: BP 123/85 mmHg  Pulse 72  Temp(Src) 97.9 F (36.6 C) (Oral)  Ht 5\' 1"  (1.549 m)  Wt 162 lb 3.2 oz (73.573 kg)  BMI 30.66 kg/m2  LMP 05/03/2010 General:   Alert and oriented. No distress noted. Pleasant and cooperative.  Head:  Normocephalic and atraumatic. Eyes:  Conjuctiva clear without scleral icterus. Mouth:  Oral mucosa pink and moist. Good dentition. No lesions. Abdomen:  +BS, soft, non-tender and non-distended. No rebound or guarding. No HSM or masses noted. Msk:   Symmetrical without gross deformities. Normal posture. Extremities:  Without edema. Neurologic:  Alert and  oriented x4;  grossly normal neurologically. Psych:  Alert and cooperative. Normal mood and affect.

## 2015-10-29 LAB — HCV RNA QUANT: HCV QUANT: NOT DETECTED [IU]/mL (ref >50)

## 2015-10-29 NOTE — Progress Notes (Signed)
Quick Note:  Good news! Virus not detected. Needs to be faxed so she can have final month of Harvoni. ______

## 2015-10-29 NOTE — Progress Notes (Signed)
Quick Note:  LFTs normal, CBC normal. STILL AWAITING HEP C RNA even though it was ordered stat. Unfortunately, this may take awhile to result. ______

## 2015-10-31 NOTE — Assessment & Plan Note (Signed)
Chronic Hep C genotype 1a with minimal fibrosis on elastography. Check labs now including RNA, so she may receive her last month of Harvoni. Labs ordered as stat. Linzess 290 mcg once daily for constipation. Return in 3 months.

## 2015-11-03 NOTE — Progress Notes (Signed)
CC'D TO PCP °

## 2015-11-05 ENCOUNTER — Telehealth: Payer: Self-pay | Admitting: *Deleted

## 2015-11-05 NOTE — Telephone Encounter (Signed)
I spoke with customer service at [employer]. pts medication was approved. They spoke with her at 12:42 today and informed her that medication will be delivered tomorrow.

## 2015-11-05 NOTE — Telephone Encounter (Signed)
Patient wants someone to call her regarding her medicaid.  She received a letter about her harvoni denying it  [phone]

## 2015-11-19 ENCOUNTER — Other Ambulatory Visit (HOSPITAL_COMMUNITY): Payer: Self-pay | Admitting: Family

## 2015-11-19 DIAGNOSIS — R52 Pain, unspecified: Secondary | ICD-10-CM

## 2015-11-20 ENCOUNTER — Other Ambulatory Visit (HOSPITAL_COMMUNITY): Payer: Self-pay | Admitting: Family

## 2015-11-20 DIAGNOSIS — R52 Pain, unspecified: Secondary | ICD-10-CM

## 2015-11-21 ENCOUNTER — Other Ambulatory Visit (HOSPITAL_COMMUNITY): Payer: Self-pay | Admitting: Family

## 2015-12-02 ENCOUNTER — Encounter (HOSPITAL_COMMUNITY): Payer: Medicaid Other

## 2016-01-28 ENCOUNTER — Encounter: Payer: Self-pay | Admitting: Gastroenterology

## 2016-01-28 ENCOUNTER — Ambulatory Visit: Payer: Medicaid Other | Admitting: Gastroenterology

## 2016-01-28 ENCOUNTER — Telehealth: Payer: Self-pay | Admitting: Gastroenterology

## 2016-01-28 NOTE — Telephone Encounter (Signed)
PT WAS A NO SHOW AND LETTER SENT  °

## 2016-02-05 NOTE — Telephone Encounter (Signed)
No-showed for follow-up after completion of Hep C treatment. This is concerning for compliance.

## 2016-02-05 NOTE — Telephone Encounter (Signed)
noted 

## 2016-03-25 ENCOUNTER — Ambulatory Visit (INDEPENDENT_AMBULATORY_CARE_PROVIDER_SITE_OTHER): Payer: Medicaid Other | Admitting: Gastroenterology

## 2016-03-25 ENCOUNTER — Encounter: Payer: Self-pay | Admitting: Gastroenterology

## 2016-03-25 VITALS — BP 113/80 | HR 70 | Temp 97.6°F | Ht 62.0 in | Wt 165.0 lb

## 2016-03-25 DIAGNOSIS — K5900 Constipation, unspecified: Secondary | ICD-10-CM

## 2016-03-25 DIAGNOSIS — B182 Chronic viral hepatitis C: Secondary | ICD-10-CM

## 2016-03-25 MED ORDER — LISINOPRIL 10 MG PO TABS: 10.0000 mg | ORAL_TABLET | Freq: Every day | ORAL | 1 refills | Status: DC

## 2016-03-25 MED ORDER — LINACLOTIDE 290 MCG PO CAPS: 290.0000 ug | ORAL_CAPSULE | Freq: Every day | ORAL | 3 refills | Status: AC

## 2016-03-25 NOTE — Progress Notes (Signed)
Referring Provider: Tylene FantasiaMuse, Rochelle D., PA-C Primary Care Physician:  Tylene FantasiaMUSE,ROCHELLE D., PA-C Primary GI: Dr. Jena Gaussourk   Chief Complaint  Patient presents with  . Follow-up    no problems  . Constipation    takes Linzess 290    HPI:   [name] is a 50 y.o. female presenting today with a history of Hep C genotype 1a, elastography score F0/F1. Completed Harvoni therapy mid August 2017. Needs Hep C RNA now, routine labs. Will arrange US abdomen as well.   Linzess 290 mcg daily, and for the most part doing well. Sometimes feels like she needs a little something extra. Feels improved since treatment. Less fatigue. No abdominal pain. No problems today. Would like a refill on Lisinopril, as she ran out yesterday, needs enough to last till [facility].    Past Medical History:  Diagnosis Date  . Blindness of right eye   . Chronic back pain   . Coronary artery disease   . Depression   . Hyperlipidemia   . Hypertension   . MI (myocardial infarction) 2005   stents  . Stroke [facility](HCC) 2002    Past Surgical History:  Procedure Laterality Date  . COLONOSCOPY  2011   Dr. Jena Gaussourk: normal   . CORONARY STENT PLACEMENT    . ESOPHAGOGASTRODUODENOSCOPY  2011   Dr. Jena Gaussourk: large focal are of erosion/superficial ulceration, path consistent with likely ischemic etiology.   . I&D EXTREMITY Right 04/03/2014   Procedure: IRRIGATION AND DEBRIDEMENT right index finger;  Surgeon: Sharma CovertFred W Ortmann, MD;  Location: [facility] OR;  Service: Orthopedics;  Laterality: Right;    Current Outpatient Prescriptions  Medication Sig Dispense Refill  . aspirin 325 MG tablet Take 325 mg by mouth daily.    . cloNIDine (CATAPRES) 0.1 MG tablet Take 0.1 mg by mouth daily as needed.    . linaclotide (LINZESS) 290 MCG CAPS capsule Take 1 capsule (290 mcg total) by mouth daily before breakfast. 30 minutes prior to breakfast. 90 capsule 3  . lisinopril (PRINIVIL,ZESTRIL) 10 MG tablet Take 1 tablet (10 mg total)  by mouth daily. 90 tablet 1  . pravastatin (PRAVACHOL) 40 MG tablet Take 40 mg by mouth at bedtime.  6   No current facility-administered medications for this visit.     Allergies as of 03/25/2016 - Review Complete 03/25/2016  Allergen Reaction Noted  . Codeine Rash 05/12/2011    Family History  Problem Relation Age of Onset  . Stroke Other   . Seizures Other   . Heart failure Other   . Cancer Other   . Asthma Other   . Stroke Mother   . Heart disease Father   . Heart disease Sister   . Heart disease Brother   . Heart disease Brother   . Stroke Brother   . Colon cancer Neg Hx     Social History   Social History  . Marital status: Single    Spouse name: N/A  . Number of children: N/A  . Years of education: N/A   Social History Main Topics  . Smoking status: Current Every Day Smoker    Packs/day: 0.50    Years: 37.00    Types: Cigarettes  . Smokeless tobacco: Never Used  . Alcohol use No  . Drug use: No     Comment: last drug use about a year ago, snorting cocaine  . Sexual activity: Yes    Birth control/ protection: Condom   Other  Topics Concern  . None   Social History Narrative  . None    Review of Systems: Negative unless mentioned in HPI   Physical Exam: BP 113/80   Pulse 70   Temp 97.6 F (36.4 C) (Oral)   Ht 5\' 2"  (1.575 m)   Wt 165 lb (74.8 kg)   LMP 05/03/2010   BMI 30.18 kg/m  General:   Alert and oriented. No distress noted. Pleasant and cooperative.  Head:  Normocephalic and atraumatic. Eyes:  Conjuctiva clear without scleral icterus. Abdomen:  +BS, soft, non-tender and non-distended. No rebound or guarding. No HSM or masses noted. Msk:  Symmetrical without gross deformities. Normal posture. Extremities:  Without edema. Neurologic:  Alert and  oriented x4;  grossly normal neurologically. Psych:  Alert and cooperative. Normal mood and affect.  Lab Results  Component Value Date   WBC 9.0 10/28/2015   HGB 14.4 10/28/2015   HCT  42.2 10/28/2015   MCV 92.3 10/28/2015   PLT 202 10/28/2015   Lab Results  Component Value Date   ALT 16 10/28/2015   AST 21 10/28/2015   ALKPHOS 59 10/28/2015   BILITOT 0.6 10/28/2015   Lab Results  Component Value Date   CREATININE 0.59 10/28/2015   BUN 10 10/28/2015   NA 132 (L) 10/28/2015   K 4.0 10/28/2015   CL 101 10/28/2015   CO2 25 10/28/2015

## 2016-03-25 NOTE — Assessment & Plan Note (Signed)
Chronic Hep C genotype 1a with minimal fibrosis on elastography. Due for HCV RNA and routine labs now. US abdomen ordered as well to ensure no significant changes since prior to treatment. If remains normal, would not need further serial ultrasounds. Return in 6 months.

## 2016-03-25 NOTE — Patient Instructions (Signed)
Please have blood work done today. We have also scheduled an ultrasound of your liver.   Continue Linzess 1 capsule each morning. You can take with food if you need it to be a little stronger, but it could cause diarrhea. Just see how it goes. If needed, add Miralax once daily as needed for extra help with constipation.  We will see you in 6 months!

## 2016-03-25 NOTE — Assessment & Plan Note (Signed)
Continue Linzess 290 mcg once daily. Overall doing well with this but occasional constipation. Discussed novel approach of taking with food instead of on an empty stomach to see if she has better effect. Cautioned regarding possible diarrhea as a side effect doing this. Otherwise, add Miralax as needed to regimen.

## 2016-03-26 NOTE — Progress Notes (Signed)
cc'ed to pcp °

## 2016-04-12 ENCOUNTER — Ambulatory Visit (HOSPITAL_COMMUNITY): Admission: RE | Admit: 2016-04-12 | Payer: Medicaid Other | Source: Ambulatory Visit

## 2016-09-10 ENCOUNTER — Other Ambulatory Visit: Payer: Self-pay | Admitting: Gastroenterology

## 2016-09-22 ENCOUNTER — Ambulatory Visit: Payer: Medicaid Other | Admitting: Gastroenterology

## 2017-09-23 ENCOUNTER — Other Ambulatory Visit: Payer: Self-pay | Admitting: Gastroenterology

## 2017-11-24 ENCOUNTER — Encounter (HOSPITAL_COMMUNITY): Payer: Self-pay

## 2017-11-24 ENCOUNTER — Emergency Department (HOSPITAL_COMMUNITY)
Admission: EM | Admit: 2017-11-24 | Discharge: 2017-11-24 | Disposition: A | Discharge status: Home / Self Care | Payer: Medicaid Other | Attending: Emergency Medicine | Admitting: Emergency Medicine

## 2017-11-24 ENCOUNTER — Other Ambulatory Visit: Payer: Self-pay

## 2017-11-24 DIAGNOSIS — Z5321 Procedure and treatment not carried out due to patient leaving prior to being seen by health care provider: Secondary | ICD-10-CM | POA: Insufficient documentation

## 2017-11-24 DIAGNOSIS — R21 Rash and other nonspecific skin eruption: Secondary | ICD-10-CM | POA: Insufficient documentation

## 2017-11-24 NOTE — ED Triage Notes (Signed)
Pt reports for the past week she has seen bugs under her skin.  Pt says she picks at them. Pt has sores all over.

## 2019-02-09 ENCOUNTER — Emergency Department (HOSPITAL_COMMUNITY)
Admission: EM | Admit: 2019-02-09 | Discharge: 2019-02-09 | Discharge status: Against Medical Advice | Payer: Medicaid Other

## 2019-02-09 ENCOUNTER — Other Ambulatory Visit: Payer: Self-pay

## 2019-02-10 ENCOUNTER — Emergency Department (HOSPITAL_COMMUNITY)
Admission: EM | Admit: 2019-02-10 | Discharge: 2019-02-10 | Disposition: A | Discharge status: Home / Self Care | Payer: Medicaid Other | Attending: Emergency Medicine | Admitting: Emergency Medicine

## 2019-02-10 ENCOUNTER — Other Ambulatory Visit: Payer: Self-pay

## 2019-02-10 ENCOUNTER — Encounter (HOSPITAL_COMMUNITY): Payer: Self-pay

## 2019-02-10 DIAGNOSIS — Z79899 Other long term (current) drug therapy: Secondary | ICD-10-CM | POA: Diagnosis not present

## 2019-02-10 DIAGNOSIS — R600 Localized edema: Secondary | ICD-10-CM

## 2019-02-10 DIAGNOSIS — M7989 Other specified soft tissue disorders: Secondary | ICD-10-CM | POA: Diagnosis present

## 2019-02-10 DIAGNOSIS — F17210 Nicotine dependence, cigarettes, uncomplicated: Secondary | ICD-10-CM | POA: Insufficient documentation

## 2019-02-10 DIAGNOSIS — I10 Essential (primary) hypertension: Secondary | ICD-10-CM | POA: Insufficient documentation

## 2019-02-10 DIAGNOSIS — Z955 Presence of coronary angioplasty implant and graft: Secondary | ICD-10-CM | POA: Insufficient documentation

## 2019-02-10 DIAGNOSIS — I259 Chronic ischemic heart disease, unspecified: Secondary | ICD-10-CM | POA: Diagnosis not present

## 2019-02-10 DIAGNOSIS — I252 Old myocardial infarction: Secondary | ICD-10-CM | POA: Insufficient documentation

## 2019-02-10 LAB — BASIC METABOLIC PANEL
Anion gap: 10 (ref 5–15)
BUN: 16 mg/dL (ref 6–20)
CO2: 28 mmol/L (ref 22–32)
Calcium: 8.6 mg/dL — ABNORMAL LOW (ref 8.9–10.3)
Chloride: 99 mmol/L (ref 98–111)
Creatinine, Ser: 0.58 mg/dL (ref 0.44–1.00)
GFR calc Af Amer: >60 mL/min (ref >60)
GFR calc non Af Amer: >60 mL/min (ref >60)
Glucose, Bld: 132 mg/dL — ABNORMAL HIGH (ref 70–99)
Potassium: 3.7 mmol/L (ref 3.5–5.1)
Sodium: 137 mmol/L (ref 135–145)

## 2019-02-10 LAB — CBC WITH DIFFERENTIAL/PLATELET
Abs Immature Granulocytes: 0.02 10*3/uL (ref 0.00–0.07)
Basophils Absolute: 0 10*3/uL (ref 0.0–0.1)
Basophils Relative: 1 %
Eosinophils Absolute: 0.3 10*3/uL (ref 0.0–0.5)
Eosinophils Relative: 4 %
HCT: 35.9 % — ABNORMAL LOW (ref 36.0–46.0)
Hemoglobin: 11.3 g/dL — ABNORMAL LOW (ref 12.0–15.0)
Immature Granulocytes: 0 %
Lymphocytes Relative: 37 %
Lymphs Abs: 2.2 10*3/uL (ref 0.7–4.0)
MCH: 31.3 pg (ref 26.0–34.0)
MCHC: 31.5 g/dL (ref 30.0–36.0)
MCV: 99.4 fL (ref 80.0–100.0)
Monocytes Absolute: 0.6 10*3/uL (ref 0.1–1.0)
Monocytes Relative: 10 %
Neutro Abs: 2.9 10*3/uL (ref 1.7–7.7)
Neutrophils Relative %: 48 %
Platelets: 225 10*3/uL (ref 150–400)
RBC: 3.61 MIL/uL — ABNORMAL LOW (ref 3.87–5.11)
RDW: 14 % (ref 11.5–15.5)
WBC: 5.9 10*3/uL (ref 4.0–10.5)
nRBC: 0 % (ref 0.0–0.2)

## 2019-02-10 MED ORDER — FUROSEMIDE 10 MG/ML IJ SOLN
20.0000 mg | Freq: Once | INTRAMUSCULAR | Status: AC
  Administered 2019-02-10: 20 mg via INTRAMUSCULAR
  Filled 2019-02-10: qty 2

## 2019-02-10 NOTE — Discharge Instructions (Addendum)
You were seen today for lower extremity swelling.  This sometimes can be related to your liver or your heart.  Continue taking the water pill as prescribed by her primary physician.  If not improving in 1 to 2 days, you need to call for further recommendations regarding adjustment in dosage.  You were given 1 dose of IV Lasix while in the emergency department.

## 2019-02-10 NOTE — ED Provider Notes (Signed)
[facility] EMERGENCY DEPARTMENT Provider Note   CSN: [id number] Arrival date & time: 02/10/19  0138     History   Chief Complaint Chief Complaint  Patient presents with  . Leg Swelling    HPI [name] is a 53 y.o. female.     HPI  This a 53 year old female with a history of coronary artery disease, hypertension, hyperlipidemia, hepatitis C who presents with lower extremity swelling.  Patient reports 2 to 3-day history of worsening lower extremity swelling.  She denies chest pain or shortness of breath.  She consulted her primary physician during virtual visit and was started on a diuretic.  She does not know how much or what medication she was placed on.  She reports fair urine output.  She denied any fevers, upper respiratory symptoms, cough, abdominal pain, nausea, vomiting.  Past Medical History:  Diagnosis Date  . Blindness of right eye   . Chronic back pain   . Coronary artery disease   . Depression   . Hyperlipidemia   . Hypertension   . MI (myocardial infarction) (HCC) 2005   stents  . Stroke [facility]) 2002    Patient Active Problem List   Diagnosis Date Noted  . Chronic hepatitis C (HCC) 07/24/2015  . Constipation 07/24/2015  . Hepatitis C antibody test positive 06/10/2015  . Left wrist fracture 04/08/2014  . Cellulitis of right hand 04/03/2014  . History of CVA (cerebrovascular accident) 04/03/2014  . Cellulitis and abscess 04/03/2014  . Fracture of thumb 04/03/2014  . Depression with anxiety 04/03/2014  . Hypertension   . Chronic back pain   . HLD (hyperlipidemia) 06/30/2010  . TOBACCO ABUSE 06/30/2010  . ATHEROSCLEROTIC CARDIOVASCULAR DISEASE 06/30/2010  . EROSIVE GASTRITIS 06/30/2010  . HEPATITIS C 04/17/2010  . ALCOHOL ABUSE 06/06/2009  . BACK PAIN, CHRONIC 06/06/2009    Past Surgical History:  Procedure Laterality Date  . COLONOSCOPY  2011   Dr. Jena Gauss: normal   . CORONARY STENT PLACEMENT    . ESOPHAGOGASTRODUODENOSCOPY  2011   Dr.  Jena Gauss: large focal are of erosion/superficial ulceration, path consistent with likely ischemic etiology.   . I&D EXTREMITY Right 04/03/2014   Procedure: IRRIGATION AND DEBRIDEMENT right index finger;  Surgeon: Sharma Covert, MD;  Location: [facility] OR;  Service: Orthopedics;  Laterality: Right;     OB History    Gravida  4   Para  3   Term  3   Preterm      AB  1   Living  3     SAB  1   TAB      Ectopic      Multiple      Live Births               Home Medications    Prior to Admission medications   Medication Sig Start Date End Date Taking? Authorizing Provider  aspirin 325 MG tablet Take 325 mg by mouth daily.    [provider]  cloNIDine (CATAPRES) 0.1 MG tablet Take 0.1 mg by mouth daily as needed.    [provider]  linaclotide Karlene Einstein) 290 MCG CAPS capsule Take 1 capsule (290 mcg total) by mouth daily before breakfast. 30 minutes prior to breakfast. 03/25/16   Gelene Mink, NP  lisinopril (PRINIVIL,ZESTRIL) 10 MG tablet TAKE ONE TABLET BY MOUTH DAILY. 09/10/16   Gelene Mink, NP  pravastatin (PRAVACHOL) 40 MG tablet Take 40 mg by mouth at bedtime. 07/11/15   [provider]    Family History Family History  Problem Relation Age of Onset  . Stroke Mother   . Heart disease Father   . Heart disease Sister   . Heart disease Brother   . Heart disease Brother   . Stroke Brother   . Stroke Other   . Seizures Other   . Heart failure Other   . Cancer Other   . Asthma Other   . Colon cancer Neg Hx     Social History Social History   Tobacco Use  . Smoking status: Current Every Day Smoker    Packs/day: 0.50    Years: 37.00    Pack years: 18.50    Types: Cigarettes  . Smokeless tobacco: Never Used  Substance Use Topics  . Alcohol use: No    Alcohol/week: 0.0 standard drinks  . Drug use: No    Types: Other-see comments    Comment: last drug use about a year ago, snorting cocaine     Allergies   Codeine   Review of  Systems Review of Systems  Constitutional: Negative for fever.  Respiratory: Negative for cough and shortness of breath.   Cardiovascular: Positive for leg swelling. Negative for chest pain.  Gastrointestinal: Negative for abdominal pain, nausea and vomiting.  Genitourinary: Negative for dysuria.  Skin: Negative for color change.  All other systems reviewed and are negative.    Physical Exam Updated Vital Signs BP 92/61   Pulse 70   Temp 98.3 F (36.8 C) (Oral)   Resp 12   Ht 1.575 m (5\' 2" )   Wt 63.5 kg   LMP 05/05/2011   SpO2 95%   BMI 25.61 kg/m   Physical Exam Vitals signs and nursing note reviewed.  Constitutional:      Appearance: She is well-developed. She is not ill-appearing.  HENT:     Head: Normocephalic and atraumatic.  Neck:     Musculoskeletal: Neck supple.  Cardiovascular:     Rate and Rhythm: Normal rate and regular rhythm.     Heart sounds: Normal heart sounds.  Pulmonary:     Effort: Pulmonary effort is normal. No respiratory distress.     Breath sounds: No wheezing.  Abdominal:     General: Bowel sounds are normal.     Palpations: Abdomen is soft.     Tenderness: There is no abdominal tenderness.  Musculoskeletal:     Comments: 1+ bilateral lower extremity swelling, pitting, to the mid shin, no overlying skin changes  Skin:    General: Skin is warm and dry.     Findings: No erythema or rash.  Neurological:     Mental Status: She is alert and oriented to person, place, and time.  Psychiatric:        Mood and Affect: Mood normal.      ED Treatments / Results  Labs (all labs ordered are listed, but only abnormal results are displayed) Labs Reviewed  CBC WITH DIFFERENTIAL/PLATELET - Abnormal; Notable for the following components:      Result Value   RBC 3.61 (*)    Hemoglobin 11.3 (*)    HCT 35.9 (*)    All other components within normal limits  BASIC METABOLIC PANEL - Abnormal; Notable for the following components:   Glucose, Bld 132  (*)    Calcium 8.6 (*)    All other components within normal limits    EKG EKG Interpretation  Date/Time:  Saturday February 10 2019 03:46:49 EDT Ventricular Rate:  72  PR Interval:    QRS Duration: 108 QT Interval:  425 QTC Calculation: 466 R Axis:   61 Text Interpretation:  Sinus rhythm Inferior infarct, age indeterminate No significant change since last tracing Confirmed by Ross MarcusHorton, Courtney ([id number]) on 02/10/2019 4:14:07 AM   Radiology No results found.  Procedures Procedures (including critical care time)  Medications Ordered in ED Medications  furosemide (LASIX) injection 20 mg (has no administration in time range)     Initial Impression / Assessment and Plan / ED Course  I have reviewed the triage vital signs and the nursing notes.  Pertinent labs & imaging results that were available during my care of the patient were reviewed by me and considered in my medical decision making (see chart for details).        Patient presents with bilateral lower extremity edema onset last several days.  No known history of heart failure.  Last echo in 2017 with a normal EF.  She is overall nontoxic-appearing.  She does have a history of hepatitis C but no known history of liver failure.  No evidence of anasarca.  No overlying skin changes to suggest cellulitis and no significant tenderness to suggest DVT.  Suspect this may be just volume overload.  Lab work obtained.  Patient with maintained kidney function and no significant electrolyte abnormality.  EKG without arrhythmia or ischemia.  Unclear what dosage of Lasix she is on.  Patient was given 20 mg IM Lasix as she is fairly nave.  Recommend continuing diuretic as an outpatient for 1-2 more days and follow-up closely with PCP.  Of note, nursing noted patient's blood pressure to downtrend.  She was sleeping.  After history, exam, and medical workup I feel the patient has been appropriately medically screened and is safe for discharge  home. Pertinent diagnoses were discussed with the patient. Patient was given return precautions.   Final Clinical Impressions(s) / ED Diagnoses   Final diagnoses:  Leg edema    ED Discharge Orders    None       Shon BatonHorton, Courtney F, MD 02/10/19 [id number]

## 2019-02-10 NOTE — ED Triage Notes (Signed)
Pt reports swelling to both legs x 1 week.  Pt states she just started fluid pills 2 days ago due to the same issue she saw her doctor via virtual visit.

## 2019-05-02 ENCOUNTER — Encounter: Payer: Self-pay | Admitting: Internal Medicine

## 2019-06-18 DEATH — deceased
